# Patient Record
Sex: Female | Born: 1960 | Race: White | Hispanic: No | Marital: Single | Smoking: Current every day smoker
Health system: Southern US, Community
[De-identification: ages and names within clinical notes are randomized; demographics above are authoritative.]

## PROBLEM LIST (undated history)

## (undated) DIAGNOSIS — E7800 Pure hypercholesterolemia, unspecified: Secondary | ICD-10-CM

## (undated) DIAGNOSIS — F410 Panic disorder [episodic paroxysmal anxiety] without agoraphobia: Secondary | ICD-10-CM

## (undated) DIAGNOSIS — F331 Major depressive disorder, recurrent, moderate: Secondary | ICD-10-CM

## (undated) DIAGNOSIS — J449 Chronic obstructive pulmonary disease, unspecified: Secondary | ICD-10-CM

## (undated) HISTORY — DX: Panic disorder (episodic paroxysmal anxiety): F41.0

## (undated) HISTORY — DX: Major depressive disorder, recurrent, moderate: F33.1

## (undated) HISTORY — DX: Pure hypercholesterolemia, unspecified: E78.00

## (undated) HISTORY — DX: Chronic obstructive pulmonary disease, unspecified: J44.9

---

## 2004-06-12 ENCOUNTER — Ambulatory Visit: Payer: Self-pay

## 2005-06-11 ENCOUNTER — Ambulatory Visit: Payer: Self-pay

## 2006-05-28 ENCOUNTER — Ambulatory Visit: Payer: Self-pay | Admitting: Nurse Practitioner

## 2007-12-23 ENCOUNTER — Ambulatory Visit: Payer: Self-pay | Admitting: *Deleted

## 2008-10-16 ENCOUNTER — Ambulatory Visit: Payer: Self-pay | Admitting: Family Medicine

## 2009-01-02 ENCOUNTER — Ambulatory Visit: Payer: Self-pay | Admitting: Family Medicine

## 2009-01-15 ENCOUNTER — Ambulatory Visit: Payer: Self-pay | Admitting: Family Medicine

## 2009-02-15 ENCOUNTER — Ambulatory Visit: Payer: Self-pay | Admitting: Unknown Physician Specialty

## 2010-03-13 ENCOUNTER — Ambulatory Visit: Payer: Self-pay | Admitting: Family Medicine

## 2011-03-26 ENCOUNTER — Ambulatory Visit: Payer: Self-pay | Admitting: Family Medicine

## 2012-04-07 ENCOUNTER — Ambulatory Visit: Payer: Self-pay | Admitting: Family Medicine

## 2013-05-04 ENCOUNTER — Ambulatory Visit: Payer: Self-pay | Admitting: Family Medicine

## 2014-05-10 ENCOUNTER — Ambulatory Visit: Payer: Self-pay | Admitting: Family Medicine

## 2014-06-08 ENCOUNTER — Ambulatory Visit: Payer: Self-pay | Admitting: Family Medicine

## 2014-07-16 ENCOUNTER — Inpatient Hospital Stay: Payer: Self-pay | Admitting: Internal Medicine

## 2014-07-16 LAB — URINALYSIS, COMPLETE
Bacteria: NONE SEEN
Bilirubin,UR: NEGATIVE
Blood: NEGATIVE
Glucose,UR: NEGATIVE mg/dL (ref 0–75)
Leukocyte Esterase: NEGATIVE
Nitrite: NEGATIVE
Ph: 6 (ref 4.5–8.0)
Protein: NEGATIVE
RBC,UR: 2 /HPF (ref 0–5)
Specific Gravity: 1.012 (ref 1.003–1.030)
Squamous Epithelial: 4
WBC UR: 5 /HPF (ref 0–5)

## 2014-07-16 LAB — COMPREHENSIVE METABOLIC PANEL
Albumin: 3.2 g/dL — ABNORMAL LOW (ref 3.4–5.0)
Alkaline Phosphatase: 56 U/L
Anion Gap: 7 (ref 7–16)
BUN: 9 mg/dL (ref 7–18)
Bilirubin,Total: 0.4 mg/dL (ref 0.2–1.0)
Calcium, Total: 9.3 mg/dL (ref 8.5–10.1)
Chloride: 104 mmol/L (ref 98–107)
Co2: 26 mmol/L (ref 21–32)
Creatinine: 0.87 mg/dL (ref 0.60–1.30)
EGFR (African American): >60
EGFR (Non-African Amer.): >60
Glucose: 98 mg/dL (ref 65–99)
Osmolality: 272 (ref 275–301)
Potassium: 3.6 mmol/L (ref 3.5–5.1)
SGOT(AST): 43 U/L — ABNORMAL HIGH (ref 15–37)
SGPT (ALT): 40 U/L
Sodium: 137 mmol/L (ref 136–145)
Total Protein: 8.1 g/dL (ref 6.4–8.2)

## 2014-07-16 LAB — CBC WITH DIFFERENTIAL/PLATELET
Basophil #: 0.1 10*3/uL (ref 0.0–0.1)
Basophil %: 0.7 %
Eosinophil #: 0 10*3/uL (ref 0.0–0.7)
Eosinophil %: 0.2 %
HCT: 44.8 % (ref 35.0–47.0)
HGB: 14.7 g/dL (ref 12.0–16.0)
Lymphocyte #: 2.4 10*3/uL (ref 1.0–3.6)
Lymphocyte %: 16.4 %
MCH: 30.4 pg (ref 26.0–34.0)
MCHC: 32.8 g/dL (ref 32.0–36.0)
MCV: 93 fL (ref 80–100)
Monocyte #: 1.1 x10 3/mm — ABNORMAL HIGH (ref 0.2–0.9)
Monocyte %: 7.6 %
Neutrophil #: 11.1 10*3/uL — ABNORMAL HIGH (ref 1.4–6.5)
Neutrophil %: 75.1 %
Platelet: 263 10*3/uL (ref 150–440)
RBC: 4.84 10*6/uL (ref 3.80–5.20)
RDW: 12.5 % (ref 11.5–14.5)
WBC: 14.7 10*3/uL — ABNORMAL HIGH (ref 3.6–11.0)

## 2014-07-16 LAB — LIPASE, BLOOD: Lipase: 94 U/L (ref 73–393)

## 2014-07-17 LAB — CBC WITH DIFFERENTIAL/PLATELET
Basophil #: 0 10*3/uL (ref 0.0–0.1)
Basophil %: 0.1 %
Eosinophil #: 0 10*3/uL (ref 0.0–0.7)
Eosinophil %: 0 %
HCT: 40.1 % (ref 35.0–47.0)
HGB: 13.5 g/dL (ref 12.0–16.0)
Lymphocyte #: 1.8 10*3/uL (ref 1.0–3.6)
Lymphocyte %: 9.7 %
MCH: 30.5 pg (ref 26.0–34.0)
MCHC: 33.6 g/dL (ref 32.0–36.0)
MCV: 91 fL (ref 80–100)
Monocyte #: 0.5 x10 3/mm (ref 0.2–0.9)
Monocyte %: 2.4 %
Neutrophil #: 16.4 10*3/uL — ABNORMAL HIGH (ref 1.4–6.5)
Neutrophil %: 87.8 %
Platelet: 256 10*3/uL (ref 150–440)
RBC: 4.43 10*6/uL (ref 3.80–5.20)
RDW: 12.4 % (ref 11.5–14.5)
WBC: 18.7 10*3/uL — ABNORMAL HIGH (ref 3.6–11.0)

## 2014-07-19 LAB — CBC WITH DIFFERENTIAL/PLATELET
Basophil #: 0 10*3/uL (ref 0.0–0.1)
Basophil %: 0.1 %
Eosinophil #: 0 10*3/uL (ref 0.0–0.7)
Eosinophil %: 0 %
HCT: 40.2 % (ref 35.0–47.0)
HGB: 13.1 g/dL (ref 12.0–16.0)
Lymphocyte #: 2.1 10*3/uL (ref 1.0–3.6)
Lymphocyte %: 7.7 %
MCH: 30.2 pg (ref 26.0–34.0)
MCHC: 32.7 g/dL (ref 32.0–36.0)
MCV: 92 fL (ref 80–100)
Monocyte #: 0.8 x10 3/mm (ref 0.2–0.9)
Monocyte %: 3.1 %
Neutrophil #: 24.1 10*3/uL — ABNORMAL HIGH (ref 1.4–6.5)
Neutrophil %: 89.1 %
Platelet: 336 10*3/uL (ref 150–440)
RBC: 4.34 10*6/uL (ref 3.80–5.20)
RDW: 12.6 % (ref 11.5–14.5)
WBC: 27 10*3/uL — ABNORMAL HIGH (ref 3.6–11.0)

## 2014-07-19 LAB — BASIC METABOLIC PANEL
Anion Gap: 7 (ref 7–16)
BUN: 14 mg/dL (ref 7–18)
Calcium, Total: 8.8 mg/dL (ref 8.5–10.1)
Chloride: 105 mmol/L (ref 98–107)
Co2: 24 mmol/L (ref 21–32)
Creatinine: 0.9 mg/dL (ref 0.60–1.30)
EGFR (African American): >60
EGFR (Non-African Amer.): >60
Glucose: 200 mg/dL — ABNORMAL HIGH (ref 65–99)
Osmolality: 278 (ref 275–301)
Potassium: 4.3 mmol/L (ref 3.5–5.1)
Sodium: 136 mmol/L (ref 136–145)

## 2014-07-19 LAB — SEDIMENTATION RATE: Erythrocyte Sed Rate: 88 mm/hr — ABNORMAL HIGH (ref 0–30)

## 2014-07-20 LAB — CBC WITH DIFFERENTIAL/PLATELET
Basophil #: 0 10*3/uL (ref 0.0–0.1)
Basophil %: 0 %
Eosinophil #: 0 10*3/uL (ref 0.0–0.7)
Eosinophil %: 0 %
HCT: 40.7 % (ref 35.0–47.0)
HGB: 13.4 g/dL (ref 12.0–16.0)
Lymphocyte #: 2.4 10*3/uL (ref 1.0–3.6)
Lymphocyte %: 9.5 %
MCH: 30.6 pg (ref 26.0–34.0)
MCHC: 32.9 g/dL (ref 32.0–36.0)
MCV: 93 fL (ref 80–100)
Monocyte #: 1.1 x10 3/mm — ABNORMAL HIGH (ref 0.2–0.9)
Monocyte %: 4.6 %
Neutrophil #: 21.4 10*3/uL — ABNORMAL HIGH (ref 1.4–6.5)
Neutrophil %: 85.9 %
Platelet: 357 10*3/uL (ref 150–440)
RBC: 4.37 10*6/uL (ref 3.80–5.20)
RDW: 12.8 % (ref 11.5–14.5)
WBC: 24.9 10*3/uL — ABNORMAL HIGH (ref 3.6–11.0)

## 2014-07-22 LAB — BASIC METABOLIC PANEL
Anion Gap: 8 (ref 7–16)
BUN: 18 mg/dL (ref 7–18)
Calcium, Total: 8.6 mg/dL (ref 8.5–10.1)
Chloride: 105 mmol/L (ref 98–107)
Co2: 26 mmol/L (ref 21–32)
Creatinine: 0.69 mg/dL (ref 0.60–1.30)
EGFR (African American): >60
EGFR (Non-African Amer.): >60
Glucose: 207 mg/dL — ABNORMAL HIGH (ref 65–99)
Osmolality: 285 (ref 275–301)
Potassium: 4.4 mmol/L (ref 3.5–5.1)
Sodium: 139 mmol/L (ref 136–145)

## 2014-07-22 LAB — WBC: WBC: 24.2 10*3/uL — ABNORMAL HIGH (ref 3.6–11.0)

## 2014-11-21 DIAGNOSIS — F331 Major depressive disorder, recurrent, moderate: Secondary | ICD-10-CM | POA: Insufficient documentation

## 2014-11-21 DIAGNOSIS — F410 Panic disorder [episodic paroxysmal anxiety] without agoraphobia: Secondary | ICD-10-CM | POA: Insufficient documentation

## 2014-11-22 NOTE — Discharge Summary (Signed)
PATIENT NAME:  Margaret Delgado, Margaret Delgado MR#:  [id number] DATE OF BIRTH:  [birth year]  DATE OF ADMISSION:  07/16/2014 DATE OF DISCHARGE:  07/23/2014  ADMITTING PHYSICIAN:  Dr. Hillary Bow  PRIMARY CARE PHYSICIAN: Delight Stare, MD.   HOSPITAL CONSULTATIONS:  Pulmonary consultation by Dr. Raul Del.   DISCHARGE DIAGNOSES:  1.  Acute respiratory failure.  2.  Acute interstitial pneumonitis.  3.  Tobacco use disorder.  4.  Anxiety.  5.  Hyperlipidemia.   DISCHARGE HOME MEDICATIONS:  1.  Lipitor 40 mg p.o. at bedtime.  2.  Sertraline 150 mg p.o. at bedtime.  3.  Ativan 0.5 mg p.o. b.i.d. p.r.n.  4.  Prednisone taper over 12 days.  5.  Levaquin 500 mg p.o. daily for three more days.  6.  Symbicort 160/4.5 two puffs twice a day.  7.  Nystatin 100,000 units/ 17m suspension - 5 mL every six hours for seven days- to swish and swallow.  8.  Albuterol inhaler two puffs 4 times a day as needed for shortness of breath and wheezing.   DISCHARGE HOME OXYGEN: None.   DISCHARGE DIET:  Regular diet.   DISCHARGE ACTIVITY: As tolerated.    FOLLOWUP INSTRUCTIONS:  1.  Follow up with Dr. FRaul Delin 1 to 2 weeks.  2.  Follow-up hypersensitivity panel results with him next week and also will need a CT chest in three months.   3.  PCP follow-up in two weeks.  4,  Advised to stop smoking.   LABORATORY AND IMAGING STUDIES:  Prior to discharge  1.  Sodium 139, potassium 4.4, chloride 105, bicarbonate 26, BUN 18, creatinine 0.69, glucose 207, and calcium of 8.6.  2.  WBC 24.2, hemoglobin 13.4, hematocrit 40.7, platelet count 357. 3.  Chest x-ray on July 22, 2014, showing stable bibasilar hazy airspace disease, left greater than right.   4.  ANA panel showing slightly elevated NT RNP antibodies which could be nonspecific.  ESR is elevated at 88, rheumatoid factor is slightly elevated again at 16.3. Ace enzyme is within normal limits. Hypersensitivity pneumonitis panel is pending.   CT of the chest with contrast  showing no evidence of PE, but patchy areas of ground glass opacity throughout bilateral lungs.  Differential could be infectious, inflammatory or hypersensitivity pneumonitis. Less likely to be alveolar hemorrhage.  Mediastinal lymphadenopathy noted. Urinalysis negative for any infection.   BRIEF HOSPITAL COURSE:  Ms. SLeverettis a 54year old Caucasian female with past medical history significant for possible underlying COPD, chronic smoking, depression, anxiety, who presents to the hospital secondary to myalgia, fever and also significant difficulty breathing.  Acute respiratory failure secondary to hypersensitivity pneumonitis, likely is hypoxic requiring up to 4 liters of oxygen now for several days into the admission. He was started on steroids, antibiotics. CT of the chest was done to rule out PE which was negative but showed possible pneumonitis. Seen by Dr. FRaul Delin the hospital who recommended to continue steroids, antibiotics and if she cannot be weaned off oxygen recommended a bronchoscopy. However, the patient started improving slowly and one week into admission she is off the oxygen and ambulating fine, only scattered wheeze on exam. She will finish off her steroids and antibiotics and will follow up with Dr. FRaul Delas an outpatient and outpatient CT chest recommended. Hypersensitivity pneumonitis pattern is pending at this time. Inhalers, bronchodilators have also been added for possible underlying COPD.  She was strongly counseled against smoking at this time. She developed oral thrush, being on steroids in  the hospital.  Nystatin is being dispensed at the time of discharge.  2.  Depression and anxiety on sertraline and Ativan. 3.  Hyperlipidemia. Continue Lipitor.  4.  Course of the patient has been otherwise uneventful in the hospital.   DISCHARGE CONDITION: Stable.   DISCHARGE DISPOSITION: Home.   TIME SPENT ON DISCHARGE:  Forty minutes.    ____________________________ Gladstone Lighter, MD rk:at D: 07/23/2014 12:18:02 ET T: 07/23/2014 12:50:18 ET JOB#: [id number]  cc: Gladstone Lighter, MD, <Dictator> Herbon E. Raul Del, MD [name], MD Gladstone Lighter MD ELECTRONICALLY SIGNED 08/14/2014 16:07

## 2014-11-22 NOTE — H&P (Signed)
PATIENT NAME:  Margaret Delgado, Margaret Delgado MR#:  [id number] DATE OF BIRTH:  Jul 04, 1961  DATE OF ADMISSION:  07/16/2014  PRIMARY CARE PHYSICIAN: Delight Stare, MD  CHIEF COMPLAINT: Shortness of breath, myalgias, fever.   HISTORY OF PRESENT ILLNESS: A 54 year old Caucasian female patient with history of tobacco abuse, hyperlipidemia, depression, presents to the Emergency Room with progressively worsening symptoms over the last 1 week of cough, shortness of breath, myalgias and fever. The patient mentions that she has had fever to the maximum of 104 on 07/11/2014. She has had multiple episodes where she did not check it, but she used to have severely high temperature, chills and then break out in sweats and feel better. The patient was seen at Urgent Care 5 days back, had influenza test done which was negative, but  considering her typical symptoms she was started on Tamiflu and today is day 5, which she finished. The patient was also treated with Augmentin for a month for chronic frontal sinusitis by her primary care physician, the antibiotics which she finished a week prior. She has been using her albuterol inhaler at home without any benefit. Here in the Emergency Room, the patient has received multiple rounds of nebulizers, continues to be short of breath, wheezing, saturations have dropped into the high 80s on room air and is being admitted to the hospitalist service for acute bronchitis and along with sepsis. Her heart rate was 95 and white count is 14.5.   PAST MEDICAL HISTORY:  1.  Hyperlipidemia.  2.  Depression.  3.  Tobacco abuse.  4.  Possible COPD.   FAMILY HISTORY: Hypertension.   SOCIAL HISTORY: The patient smokes a pack a day. No alcohol. No illicit drug use. Ambulates on her own.   CODE STATUS: Full code.   ALLERGIES: No drug allergies.   REVIEW OF SYSTEMS:  CONSTITUTIONAL: Complains of severe fatigue, weakness.  EYES: No blurred vision, pain or redness.  EARS, NOSE, AND THROAT: No tinnitus,  ear pain, hearing loss.  RESPIRATORY: Has cough, wheeze, tobacco abuse.  CARDIOVASCULAR: No chest pain, orthopnea, edema. GASTROINTESTINAL: No nausea, vomiting, diarrhea or abdominal pain. GENITOURINARY: No dysuria, hematuria or frequency. ENDOCRINOLOGY: No polyuria, nocturia or thyroid problems. HEMATOLOGY AND LYMPHATICS: No anemia, easy bruising, bleeding. INTEGUMENTARY: No acne, rash, lesion.  MUSCULOSKELETAL: Has generalized muscle aches. NEUROLOGICAL: No focal numbness, weakness, seizure.  PSYCHIATRY: Has anxiety.   HOME MEDICATIONS: Atorvastatin, lorazepam, albuterol and Zoloft of unknown doses.   PHYSICAL EXAMINATION:  VITAL SIGNS: Shows temperature 98.1, pulse of 95, blood pressure 120/62, saturating 87% on room air, 91% on 2 liters oxygen.  GENERAL: Obese Caucasian female patient sitting at the side of the bed with respiratory distress, 1 or 2 word conversational dyspnea, audible wheezing.  PSYCHIATRIC: Alert, oriented x 3, anxious.  HEENT: Atraumatic, normocephalic. Oral mucosa dry and pink. External ears and nose normal. No pallor. No icterus. Pupils bilaterally equal and reactive to light.  NECK: Supple. No thyromegaly. No palpable lymph nodes. Trachea midline. No carotid bruit, JVD.  CARDIOVASCULAR: S1, S2, tachycardic without any murmurs. Peripheral pulses 2+. No edema.  RESPIRATORY: Increased work of breathing, bilateral wheezing, coarse breath sounds.  GASTROINTESTINAL: Soft abdomen, nontender. Bowel sounds present. Organomegaly palpable.  SKIN: Warm and dry. No petechiae, rash, ulcers.  MUSCULOSKELETAL: No joint swelling, redness, effusion of the large joints. Normal muscle tone.  NEUROLOGICAL: Motor strength 5/5 in upper extremities. Sensation intact all over.  LYMPHATIC: No cervical lymphadenopathy.    LABORATORY AND IMAGING STUDIES: Chest x-ray shows bilateral  basilar atelectasis, no acute findings.   Glucose 98, BUN 9, creatinine 0.87, sodium 137, potassium 3.6, GFR  greater than 60. AST, ALT, alkaline phosphatase, bilirubin normal. WBC 14.7, hemoglobin 14.7, platelets 263,000,  neutrophils 75%.   ASSESSMENT AND PLAN:  1.  Acute bronchitis with acute respiratory failure and sepsis in a patient who continues to smoke. At this point, the patient will be admitted as inpatient and started on scheduled nebulizers, IV steroids and antibiotics with Levaquin. The patient had influenza test done at Urgent Care which was negative. She has finished a course of Tamiflu. The patient has been counseled to quit smoking for greater than 3 minutes. We will also give her 500 mL of normal saline now for her sepsis bolus. Scheduled nebulizers. Send for sputum cultures.  2.  Anxiety. The patient takes lorazepam at home, which will be continued p.r.n.   CODE STATUS: Full code.   TIME SPENT ON THIS CASE: Forty-five minutes.   ____________________________ Leia Alf Jeffery Gammell, MD srs:TT D: 07/16/2014 19:12:55 ET T: 07/16/2014 20:18:44 ET JOB#: [id number]  cc: Alveta Heimlich R. Darvin Neighbours, MD, <Dictator> [name], MD Neita Carp MD ELECTRONICALLY SIGNED 08/11/2014 12:46

## 2015-05-11 ENCOUNTER — Other Ambulatory Visit: Payer: Self-pay | Admitting: Family Medicine

## 2015-05-11 DIAGNOSIS — Z1231 Encounter for screening mammogram for malignant neoplasm of breast: Secondary | ICD-10-CM

## 2015-05-23 ENCOUNTER — Ambulatory Visit: Payer: Self-pay

## 2015-05-31 ENCOUNTER — Ambulatory Visit
Admission: RE | Admit: 2015-05-31 | Discharge: 2015-05-31 | Disposition: A | Discharge status: Home / Self Care | Payer: Medicare Other | Source: Ambulatory Visit | Attending: Family Medicine | Admitting: Family Medicine

## 2015-05-31 ENCOUNTER — Other Ambulatory Visit: Payer: Self-pay | Admitting: Family Medicine

## 2015-05-31 DIAGNOSIS — Z1231 Encounter for screening mammogram for malignant neoplasm of breast: Secondary | ICD-10-CM | POA: Diagnosis present

## 2015-09-17 ENCOUNTER — Encounter: Payer: Self-pay | Admitting: Psychiatry

## 2015-09-17 ENCOUNTER — Ambulatory Visit: Payer: Medicare Other | Admitting: Psychiatry

## 2015-09-17 DIAGNOSIS — I10 Essential (primary) hypertension: Secondary | ICD-10-CM | POA: Insufficient documentation

## 2015-09-17 DIAGNOSIS — I739 Peripheral vascular disease, unspecified: Secondary | ICD-10-CM | POA: Insufficient documentation

## 2015-09-17 DIAGNOSIS — E785 Hyperlipidemia, unspecified: Secondary | ICD-10-CM | POA: Insufficient documentation

## 2015-09-17 MED ORDER — TRAZODONE HCL 50 MG PO TABS: 50.0000 mg | ORAL_TABLET | Freq: Every day | ORAL | Status: DC

## 2015-09-17 MED ORDER — VENLAFAXINE HCL ER 37.5 MG PO CP24: 37.5000 mg | ORAL_CAPSULE | Freq: Every day | ORAL | Status: DC

## 2015-09-17 NOTE — Progress Notes (Signed)
[facility] MD/PA/NP OP Progress Note  09/17/2015 10:50 AM Margaret Delgado  MRN:  [id number]  Subjective:  Patient is a 55 yo WF with history of MDD who was seen by Dr.Williams in March of 2016 for an initial assessment. She had not followed up after that. States she did not connect with Dr.Williams. States she has not been taking any medication for her depression, taking Lorazepam twice daily as prescribed by her primary care physician Dr.Miles. Patient today reports being depressed, having no energy, difficulty getting out of bed and feeling like she has nothing to live for. States she has not been taking care of her house or cleaning up. Denies suicidal thoughts. States she has realized she needs to be on medication for her depression and understands she cannot just wean herself of medications. States she has in the past taken antidepressants for 3-4 months and stopped once she started feeling better. Patient reports having obsessive thoughts about death and religious thoughts surrounding dying. She has 2 teenage boys and the father lives close by but is not supportive. Patient gets disability but states that is not enough for her expenses.  States she would like to feel better and get a part time job.  Patients reports some binges with alcohol. The last time she drank was 2 months ago and drank 4 beers daily for few days.  Chief Complaint:  Chief Complaint    Follow-up; Medication Refill; Anxiety; Depression; Fatigue; Stress     Visit Diagnosis:  No diagnosis found.  Past Medical History:  Past Medical History  Diagnosis Date  . Major depressive disorder, recurrent episode, moderate ([place])   . Panic disorder without agoraphobia   . COPD (chronic obstructive pulmonary disease) ([place])   . Hypercholesterolemia     Past Surgical History  Procedure Laterality Date  . Cesarean section     Family History:  Family History  Problem Relation Age of Onset  . Breast cancer Paternal Grandmother 16  .  Parkinson's disease Mother   . Depression Mother   . Anxiety disorder Mother   . Cancer Father   . Stroke Sister   . Anxiety disorder Sister   . Depression Sister    Social History:  Social History   Social History  . Marital Status: Single    Spouse Name: N/A  . Number of Children: N/A  . Years of Education: N/A   Social History Main Topics  . Smoking status: Current Every Day Smoker -- 1.00 packs/day    Types: Cigarettes    Start date: 09/16/1980  . Smokeless tobacco: Never Used  . Alcohol Use: No  . Drug Use: No  . Sexual Activity: Not Currently   Other Topics Concern  . None   Social History Narrative   Additional History: Patient has long history of MDD and anxiety. She and reports severe anxiety surrounding her ex and his mother. Reports anxiety about death and dying. States this has become worse in the last couple of months. SHe has never been hospitalized psychiatrically and has never had any suicide attempts.  Assessment:   Musculoskeletal: Strength & Muscle Tone: within normal limits Gait & Station: normal Patient leans: N/A  Psychiatric Specialty Exam: HPI  ROS  Blood pressure 132/82, pulse 81, temperature 97.4 F (36.3 C), temperature source Tympanic, height 5' 5.5" (1.664 m), weight 206 lb (93.441 kg), SpO2 96 %.Body mass index is 33.75 kg/(m^2).  General Appearance: Casual  Eye Contact:  Fair  Speech:  Clear and Coherent  Volume:  Normal  Mood:  Depressed and Dysphoric  Affect:  Constricted and Depressed  Thought Process:  Coherent  Orientation:  Full (Time, Place, and Person)  Thought Content:  Rumination  Suicidal Thoughts:  No  Homicidal Thoughts:  No  Memory:  Immediate;   Fair Recent;   Fair Remote;   Fair  Judgement:  Fair  Insight:  Fair  Psychomotor Activity:  Normal  Concentration:  Fair  Recall:  [employer] of Knowledge: Fair  Language: Fair  Akathisia:  No  Handed:  Right  AIMS (if indicated):    Assets:  Communication  Skills Desire for Improvement Housing  ADL's:  Intact  Cognition: WNL  Sleep:  okay   Is the patient at risk to self?  No. Has the patient been a risk to self in the past 6 months?  No. Has the patient been a risk to self within the distant past?  No. Is the patient a risk to others?  No. Has the patient been a risk to others in the past 6 months?  No. Has the patient been a risk to others within the distant past?  No.  Current Medications: Current Outpatient Prescriptions  Medication Sig Dispense Refill  . atorvastatin (LIPITOR) 40 MG tablet Take 1 tablet by mouth daily.    . clonazePAM (KLONOPIN) 0.5 MG tablet Take 0.5 mg by mouth 2 (two) times daily as needed for anxiety. 1 tablet two times daily as needed    . Multiple Vitamin tablet Take 1 tablet by mouth daily.     No current facility-administered medications for this visit.    Medical Decision Making:  Review of Psycho-Social Stressors (1), Established Problem, Worsening (2) and Review of New Medication or Change in Dosage (2)  Treatment Plan Summary:Medication management   MDD Start Effexor at 37.5mg  po qd. Side effects and benefits discussed. Increase patient to eat healthy and exercise since it helps with depression and anxiety. Decrease clonazepam to 1 tablet in the morning at 0.5 mg with plan to discontinue totally in the future. Patient agreeable to this plan  Generalized anxiety disorder Same as above for depression.  Insomnia Trazodone at 50 mg at bedtime.  Time spent with patient was 40 minutes and more than half the time was spent in counseling and coordination of care.   [name] 09/17/2015, 10:50 AM

## 2015-09-17 NOTE — Progress Notes (Signed)
Patient ID: Margaret Delgado, female   DOB: 06-02-1961, 55 y.o.   MRN: [id number]

## 2015-10-01 ENCOUNTER — Encounter: Payer: Self-pay | Admitting: Psychiatry

## 2015-10-01 ENCOUNTER — Ambulatory Visit: Payer: Medicare Other | Admitting: Psychiatry

## 2015-10-01 MED ORDER — HYDROXYZINE PAMOATE 25 MG PO CAPS: 25.0000 mg | ORAL_CAPSULE | Freq: Three times a day (TID) | ORAL | Status: DC | PRN

## 2015-10-01 MED ORDER — SERTRALINE HCL 25 MG PO TABS: 25.0000 mg | ORAL_TABLET | Freq: Every day | ORAL | Status: DC

## 2015-10-01 NOTE — Progress Notes (Signed)
Patient ID: Margaret Delgado, female   DOB: 1961-04-04, 55 y.o.   MRN: [id number] [facility] MD/PA/NP OP Progress Note  10/01/2015 9:54 AM JEYDA EDGEMAN  MRN:  [id number]  Subjective:  Patient is a 55 yo WF with history of MDD who was started on Effexor at 37.5 mg 2 weeks ago for severe depression. Margaret Delgado presents today stating that she has not had any relief from her depressed mood on the Effexor. She reports that she has had some severe jaw clenching and teeth grinding. She appears very anxious today. States that she is very overwhelmed by all the chores she needs to do at home. States that she had some trouble with her washing machine but was able to complete that. She denies any suicidal thoughts. States that she was able to taper off the Klonopin without any side effects. She also reports that she drinks about 8-10 cups of coffee daily. States that she has not been able to sleep on the trazodone and that it made her more restless.  Patients reports some binges with alcohol. The last time she drank was 2 months ago and drank 4 beers daily for few days.  Chief Complaint:  Chief Complaint    Follow-up; Medication Refill; Medication Problem; Panic Attack; Anxiety     Visit Diagnosis:  No diagnosis found.  Past Medical History:  Past Medical History  Diagnosis Date  . Major depressive disorder, recurrent episode, moderate ([place])   . Panic disorder without agoraphobia   . COPD (chronic obstructive pulmonary disease) ([place])   . Hypercholesterolemia     Past Surgical History  Procedure Laterality Date  . Cesarean section     Family History:  Family History  Problem Relation Age of Onset  . Breast cancer Paternal Grandmother 73  . Parkinson's disease Mother   . Depression Mother   . Anxiety disorder Mother   . Cancer Father   . Stroke Sister   . Anxiety disorder Sister   . Depression Sister    Social History:  Social History   Social History  . Marital Status: Single    Spouse Name: N/A   . Number of Children: N/A  . Years of Education: N/A   Social History Main Topics  . Smoking status: Current Every Day Smoker -- 1.00 packs/day    Types: Cigarettes    Start date: 09/16/1980  . Smokeless tobacco: Never Used  . Alcohol Use: No  . Drug Use: No  . Sexual Activity: Not Currently   Other Topics Concern  . None   Social History Narrative   Additional History: Patient has long history of MDD and anxiety. She and reports severe anxiety surrounding her ex and his mother. Reports anxiety about death and dying. States this has become worse in the last couple of months. SHe has never been hospitalized psychiatrically and has never had any suicide attempts.  Assessment:   Musculoskeletal: Strength & Muscle Tone: within normal limits Gait & Station: normal Patient leans: N/A  Psychiatric Specialty Exam: Anxiety      ROS  Blood pressure 124/82, pulse 81, temperature 98.7 F (37.1 C), temperature source Tympanic, height 5' 5.5" (1.664 m), weight 207 lb 6.4 oz (94.076 kg), SpO2 96 %.Body mass index is 33.98 kg/(m^2).  General Appearance: Casual  Eye Contact:  Fair  Speech:  Clear and Coherent  Volume:  Normal  Mood:  Depressed and Dysphoric  Affect:  Constricted and Depressed  Thought Process:  Coherent  Orientation:  Full (Time, Place,  and Person)  Thought Content:  Rumination  Suicidal Thoughts:  No  Homicidal Thoughts:  No  Memory:  Immediate;   Fair Recent;   Fair Remote;   Fair  Judgement:  Fair  Insight:  Fair  Psychomotor Activity:  Normal  Concentration:  Fair  Recall:  [employer] of Knowledge: Fair  Language: Fair  Akathisia:  No  Handed:  Right  AIMS (if indicated):    Assets:  Communication Skills Desire for Improvement Housing  ADL's:  Intact  Cognition: WNL  Sleep:  okay   Is the patient at risk to self?  No. Has the patient been a risk to self in the past 6 months?  No. Has the patient been a risk to self within the distant past?   No. Is the patient a risk to others?  No. Has the patient been a risk to others in the past 6 months?  No. Has the patient been a risk to others within the distant past?  No.  Current Medications: Current Outpatient Prescriptions  Medication Sig Dispense Refill  . atorvastatin (LIPITOR) 40 MG tablet Take 1 tablet by mouth daily.    . Multiple Vitamin tablet Take 1 tablet by mouth daily.    . traZODone (DESYREL) 50 MG tablet Take 1 tablet (50 mg total) by mouth at bedtime. 30 tablet 0  . venlafaxine XR (EFFEXOR XR) 37.5 MG 24 hr capsule Take 1 capsule (37.5 mg total) by mouth daily. 30 capsule 0  . clonazePAM (KLONOPIN) 0.5 MG tablet Take 0.5 mg by mouth 2 (two) times daily as needed for anxiety. Reported on 10/01/2015     No current facility-administered medications for this visit.    Medical Decision Making:  Review of Psycho-Social Stressors (1), Established Problem, Worsening (2) and Review of New Medication or Change in Dosage (2)  Treatment Plan Summary:Medication management   MDD Discontinue the Effexor. Start zoloft at 25mg  po qam. Encouraged  patient to eat healthy and exercise since it helps with depression and anxiety. Discussed starting a journal with brainstorming all the chores she needs to do and prioritizing about 5 chores and putting them down in order. Patient agreeable to this plan  Generalized anxiety disorder Same as above for depression.  Insomnia Patient recommended to drink chamomile tea. Discussed some sleep hygiene techniques. Decrease Coffee intake to 3-4 cups daily before 1:00 pm.  Time spent with patient was 25 minutes and more than half the time was spent in counseling and coordination of care.   [name] 10/01/2015, 9:54 AM

## 2015-10-16 ENCOUNTER — Ambulatory Visit: Payer: Medicare Other | Admitting: Psychiatry

## 2015-10-17 ENCOUNTER — Ambulatory Visit (INDEPENDENT_AMBULATORY_CARE_PROVIDER_SITE_OTHER): Payer: Medicare Other | Admitting: Psychiatry

## 2015-10-17 ENCOUNTER — Encounter: Payer: Self-pay | Admitting: Psychiatry

## 2015-10-17 DIAGNOSIS — F331 Major depressive disorder, recurrent, moderate: Secondary | ICD-10-CM | POA: Diagnosis not present

## 2015-10-17 MED ORDER — HYDROXYZINE PAMOATE 50 MG PO CAPS: 50.0000 mg | ORAL_CAPSULE | Freq: Three times a day (TID) | ORAL | Status: DC | PRN

## 2015-10-17 MED ORDER — SERTRALINE HCL 50 MG PO TABS: 50.0000 mg | ORAL_TABLET | Freq: Every day | ORAL | Status: DC

## 2015-10-17 NOTE — Progress Notes (Signed)
Patient ID: Margaret Delgado, female   DOB: 30-May-1961, 55 y.o.   MRN: [id number] [facility] MD/PA/NP OP Progress Note  10/17/2015 10:33 AM Margaret Delgado  MRN:  [id number]  Subjective:  Patient is a 55 yo WF with history of MDD who presents for a follow up today. States she did well for a few days and again had a couple of bad days.  Patient reports that she really felt well for 3 days and then over the last 2 days she has not been doing so well. States that she continues to get overwhelmed with her chores. She did look a little brighter and states that she actually took a shower yesterday. However she also reports looking into the mirror and being very upset about all the weight she has gained. States that she has been making efforts to eat more healthy. Denies any drinking binges since her last visit. States that she enjoys going to her church and being in the group. She is right now coming from a church activity and states that this gives her some energy.  Chief Complaint:  Chief Complaint    Follow-up; Medication Refill     Visit Diagnosis:  No diagnosis found.  Past Medical History:  Past Medical History  Diagnosis Date  . Major depressive disorder, recurrent episode, moderate ([place])   . Panic disorder without agoraphobia   . COPD (chronic obstructive pulmonary disease) ([place])   . Hypercholesterolemia     Past Surgical History  Procedure Laterality Date  . Cesarean section     Family History:  Family History  Problem Relation Age of Onset  . Breast cancer Paternal Grandmother 70  . Parkinson's disease Mother   . Depression Mother   . Anxiety disorder Mother   . Cancer Father   . Stroke Sister   . Anxiety disorder Sister   . Depression Sister    Social History:  Social History   Social History  . Marital Status: Single    Spouse Name: N/A  . Number of Children: N/A  . Years of Education: N/A   Social History Main Topics  . Smoking status: Current Every Day Smoker -- 1.00  packs/day    Types: Cigarettes    Start date: 09/16/1980  . Smokeless tobacco: Never Used  . Alcohol Use: No  . Drug Use: No  . Sexual Activity: Not Currently   Other Topics Concern  . None   Social History Narrative   Additional History: Patient has long history of MDD and anxiety. She and reports severe anxiety surrounding her ex and his mother. Reports anxiety about death and dying. States this has become worse in the last couple of months. SHe has never been hospitalized psychiatrically and has never had any suicide attempts.  Assessment:   Musculoskeletal: Strength & Muscle Tone: within normal limits Gait & Station: normal Patient leans: N/A  Psychiatric Specialty Exam: Anxiety      ROS  Blood pressure 120/82, pulse 78, temperature 97.4 F (36.3 C), temperature source Tympanic, height 5' 5.5" (1.664 m), weight 202 lb 6.4 oz (91.808 kg), SpO2 95 %.Body mass index is 33.16 kg/(m^2).  General Appearance: Casual  Eye Contact:  Fair  Speech:  Clear and Coherent  Volume:  Normal  Mood:  Depressed and Dysphoric  Affect:  Constricted and Depressed  Thought Process:  Coherent  Orientation:  Full (Time, Place, and Person)  Thought Content:  Rumination  Suicidal Thoughts:  No  Homicidal Thoughts:  No  Memory:  Immediate;   Fair Recent;   Fair Remote;   Fair  Judgement:  Fair  Insight:  Fair  Psychomotor Activity:  Normal  Concentration:  Fair  Recall:  [employer] of Knowledge: Fair  Language: Fair  Akathisia:  No  Handed:  Right  AIMS (if indicated):    Assets:  Communication Skills Desire for Improvement Housing  ADL's:  Intact  Cognition: WNL  Sleep:  okay   Is the patient at risk to self?  No. Has the patient been a risk to self in the past 6 months?  No. Has the patient been a risk to self within the distant past?  No. Is the patient a risk to others?  No. Has the patient been a risk to others in the past 6 months?  No. Has the patient been a risk to  others within the distant past?  No.  Current Medications: Current Outpatient Prescriptions  Medication Sig Dispense Refill  . atorvastatin (LIPITOR) 40 MG tablet Take 1 tablet by mouth daily.    . budesonide-formoterol (SYMBICORT) 160-4.5 MCG/ACT inhaler Inhale 2 puffs into the lungs 2 (two) times daily.    . Multiple Vitamin tablet Take 1 tablet by mouth daily.    . sertraline (ZOLOFT) 25 MG tablet Take 1 tablet (25 mg total) by mouth daily. 30 tablet 0   No current facility-administered medications for this visit.    Medical Decision Making:  Review of Psycho-Social Stressors (1), Established Problem, Worsening (2) and Review of New Medication or Change in Dosage (2)  Treatment Plan Summary:Medication management   MDD Increase Zoloft to 50 mg taken once daily for 2 weeks and patient can increase it to 75 mg if she tolerates the 50mg . Continue to eat healthy and exercise since it helps with depression and anxiety. Continue with journal with brainstorming all the chores she needs to do and prioritizing about 5 chores and putting them down in order.  Generalized anxiety disorder Same as above for depression.  Insomnia Patient recommended to drink chamomile tea. Discussed some sleep hygiene techniques.   Time spent with patient was 25 minutes and more than half the time was spent in counseling and coordination of care.   Margaret Delgado 10/17/2015, 10:33 AM

## 2015-10-31 ENCOUNTER — Ambulatory Visit: Payer: Medicare Other | Admitting: Anesthesiology

## 2015-11-12 ENCOUNTER — Other Ambulatory Visit: Payer: Self-pay

## 2015-11-12 NOTE — Telephone Encounter (Signed)
received faxed requesting a refill on medication sertraline hcl 50mg  take 1 and 1/2 tabs daily

## 2015-11-14 MED ORDER — SERTRALINE HCL 50 MG PO TABS: ORAL_TABLET | ORAL | Status: DC

## 2015-11-21 ENCOUNTER — Ambulatory Visit (INDEPENDENT_AMBULATORY_CARE_PROVIDER_SITE_OTHER): Payer: Medicare Other | Admitting: Psychiatry

## 2015-11-21 ENCOUNTER — Encounter: Payer: Self-pay | Admitting: Psychiatry

## 2015-11-21 VITALS — BP 128/86 | HR 73 | Temp 97.9°F | Ht 65.5 in | Wt 199.4 lb

## 2015-11-21 DIAGNOSIS — F331 Major depressive disorder, recurrent, moderate: Secondary | ICD-10-CM | POA: Diagnosis not present

## 2015-11-21 MED ORDER — SERTRALINE HCL 100 MG PO TABS: ORAL_TABLET | ORAL | Status: DC

## 2015-11-21 MED ORDER — BUPROPION HCL 75 MG PO TABS: 37.5000 mg | ORAL_TABLET | Freq: Every morning | ORAL | Status: DC

## 2015-11-21 NOTE — Progress Notes (Signed)
Patient ID: LORALYNN VOKES, female   DOB: 1961/02/03, 55 y.o.   MRN: [id number]  [facility] MD/PA/NP OP Progress Note  11/21/2015 11:27 AM CYERA GARDE  MRN:  [id number]  Subjective:  Patient is a 55 yo WF with history of MDD who presents for a follow up today. States she continues to feel depressed on zoloft at 75mg  daily. States she feels like she is just "existing". Continues to have trouble getting out of bed, decreased energy and no motivation. Sleeping okay, says she eats too much. Has not been to [place] in a while. Says her anxiety is better. Says she is not crying as much. Her sons are concerned that she is always in bed. She feels bad about not having a productive life. She and has had better days at her last visit but this time and reports increased depression again. Denies any suicidal thoughts.   Chief Complaint: still depressed  Visit Diagnosis:     ICD-9-CM ICD-10-CM   1. MDD (major depressive disorder), recurrent episode, moderate ([place]) 296.32 F33.1     Past Medical History:  Past Medical History  Diagnosis Date  . Major depressive disorder, recurrent episode, moderate ([place])   . Panic disorder without agoraphobia   . COPD (chronic obstructive pulmonary disease) ([place])   . Hypercholesterolemia     Past Surgical History  Procedure Laterality Date  . Cesarean section     Family History:  Family History  Problem Relation Age of Onset  . Breast cancer Paternal Grandmother 62  . Parkinson's disease Mother   . Depression Mother   . Anxiety disorder Mother   . Cancer Father   . Stroke Sister   . Anxiety disorder Sister   . Depression Sister    Social History:  Social History   Social History  . Marital Status: Single    Spouse Name: N/A  . Number of Children: N/A  . Years of Education: N/A   Social History Main Topics  . Smoking status: Current Every Day Smoker -- 1.00 packs/day    Types: Cigarettes    Start date: 09/16/1980  . Smokeless tobacco: Never Used  .  Alcohol Use: No  . Drug Use: No  . Sexual Activity: Not Currently   Other Topics Concern  . Not on file   Social History Narrative   Additional History: Patient has long history of MDD and anxiety. She and reports severe anxiety surrounding her ex and his mother. Reports anxiety about death and dying. States this has become worse in the last couple of months. SHe has never been hospitalized psychiatrically and has never had any suicide attempts.  Assessment:   Musculoskeletal: Strength & Muscle Tone: within normal limits Gait & Station: normal Patient leans: N/A  Psychiatric Specialty Exam: Anxiety      ROS  There were no vitals taken for this visit.There is no weight on file to calculate BMI.  General Appearance: Casual  Eye Contact:  Fair  Speech:  Clear and Coherent  Volume:  Normal  Mood:  Depressed and Dysphoric  Affect:  restricted  Thought Process:  Coherent  Orientation:  Full (Time, Place, and Person)  Thought Content:  Rumination  Suicidal Thoughts:  No  Homicidal Thoughts:  No  Memory:  Immediate;   Fair Recent;   Fair Remote;   Fair  Judgement:  Fair  Insight:  Fair  Psychomotor Activity:  Normal  Concentration:  Fair  Recall:  [employer] of Knowledge: Fair  Language: Fair  Akathisia:  No  Handed:  Right  AIMS (if indicated):    Assets:  Communication Skills Desire for Improvement Housing  ADL's:  Intact  Cognition: WNL  Sleep:  okay   Is the patient at risk to self?  No. Has the patient been a risk to self in the past 6 months?  No. Has the patient been a risk to self within the distant past?  No. Is the patient a risk to others?  No. Has the patient been a risk to others in the past 6 months?  No. Has the patient been a risk to others within the distant past?  No.  Current Medications: Current Outpatient Prescriptions  Medication Sig Dispense Refill  . atorvastatin (LIPITOR) 40 MG tablet Take 1 tablet by mouth daily.    .  budesonide-formoterol (SYMBICORT) 160-4.5 MCG/ACT inhaler Inhale 2 puffs into the lungs 2 (two) times daily.    . hydrOXYzine (VISTARIL) 50 MG capsule Take 1 capsule (50 mg total) by mouth 3 (three) times daily as needed. 30 capsule 0  . Multiple Vitamin tablet Take 1 tablet by mouth daily.    . sertraline (ZOLOFT) 50 MG tablet Take 1 and 1/2 tablets by mouth daily. For a total of 75mg  45 tablet 0   No current facility-administered medications for this visit.    Medical Decision Making:  Review of Psycho-Social Stressors (1), Established Problem, Worsening (2) and Review of New Medication or Change in Dosage (2)  Treatment Plan Summary:Medication management   MDD Increase Zoloft to 100 mg po daily. Start Bupropion at 37.5mg  po qam in the morning. Continue to eat healthy and exercise since it helps with depression and anxiety.  Generalized anxiety disorder Same as above for depression.  Discussed with patient having a structured day to help her get out of bed and has a list of chores to do.  Time spent with patient was 25 minutes and more than half the time was spent in counseling and coordination of care. Return to clinic in 2-4 weeks time or call before if needed.   Sameka Bagent 11/21/2015, 11:27 AM

## 2015-11-22 ENCOUNTER — Telehealth: Payer: Self-pay

## 2015-11-22 NOTE — Telephone Encounter (Signed)
[phone] 

## 2015-11-22 NOTE — Telephone Encounter (Signed)
RECEIVED TWO MESSAGE FROM [facility].  PT WAS SEEN 11-21-15 AND THE RX YOU SENT OVER NEED TO BE CORRECTED AND RESENT.   1ST RX WAS ZOLOFT YOU SENT OVER A 100MG  TAKE 1 AND 1/2 TABLET BY MOUTH DAILY FOR A TOTAL OF 75MG .  IF YOU WANT 75MG  THEN YOU WILL NEED TO SEND A ZOLOFT 50MG  TAKE ONE AND 1/2 TO = 75MG  2ND RX WAS FOR THE WELLBUTRIN TAKE A 1/2 TABLET THAT MEDICATION CAN NOT BE CUT IN HALF.

## 2015-11-22 NOTE — Telephone Encounter (Signed)
Can you send me the pharmacy number please?

## 2015-12-05 ENCOUNTER — Encounter: Payer: Self-pay | Admitting: Psychiatry

## 2015-12-05 ENCOUNTER — Ambulatory Visit (INDEPENDENT_AMBULATORY_CARE_PROVIDER_SITE_OTHER): Payer: Medicare Other | Admitting: Psychiatry

## 2015-12-05 VITALS — BP 120/80 | HR 77 | Temp 97.9°F | Ht 65.5 in | Wt 201.4 lb

## 2015-12-05 DIAGNOSIS — F331 Major depressive disorder, recurrent, moderate: Secondary | ICD-10-CM

## 2015-12-05 MED ORDER — SERTRALINE HCL 100 MG PO TABS: ORAL_TABLET | ORAL | Status: DC

## 2015-12-05 MED ORDER — BUPROPION HCL 75 MG PO TABS: 75.0000 mg | ORAL_TABLET | Freq: Every morning | ORAL | Status: DC

## 2015-12-05 NOTE — Progress Notes (Signed)
Patient ID: Margaret Delgado, female   DOB: 1960/09/05, 55 y.o.   MRN: [id number]   [facility] MD/PA/NP OP Progress Note  12/05/2015 10:14 AM Margaret Delgado  MRN:  [id number]  Subjective:  Patient is a 55 yo WF with history of MDD who presents for a follow up today. Patient tolerating the increase in Zoloft well. She feels the Wellbutrin is helping as well. She has more energy to do things, she is less depressed. Her anxiety is improving. States she feels motivated but having trouble doing things that she wants to do. States that she does think a lot about death and dying. She kind of questions the purpose of doing things if we are all going to die anyways. Discussed with patient that this is called existential crisis and this is something many people grappled with. Discussed that she could benefit from seeing a therapist and  develop coping skills to deal with these issues . She reports that mood has improved significantly over the last 3 days and on a scale of 1-10 with 10 being her best mood she feels she is at an 8. She has not been crying. Patient appears with a more animated affect today. Sleeping well and eating okay.Denies any suicidal thoughts.   Chief Complaint: Mood improving Chief Complaint    Anxiety; Follow-up; Medication Refill     Visit Diagnosis:     ICD-9-CM ICD-10-CM   1. MDD (major depressive disorder), recurrent episode, moderate ([place]) 296.32 F33.1     Past Medical History:  Past Medical History  Diagnosis Date  . Major depressive disorder, recurrent episode, moderate ([place])   . Panic disorder without agoraphobia   . COPD (chronic obstructive pulmonary disease) ([place])   . Hypercholesterolemia     Past Surgical History  Procedure Laterality Date  . Cesarean section     Family History:  Family History  Problem Relation Age of Onset  . Breast cancer Paternal Grandmother 51  . Parkinson's disease Mother   . Depression Mother   . Anxiety disorder Mother   . Cancer Father   .  Stroke Sister   . Anxiety disorder Sister   . Depression Sister    Social History:  Social History   Social History  . Marital Status: Single    Spouse Name: N/A  . Number of Children: N/A  . Years of Education: N/A   Social History Main Topics  . Smoking status: Current Every Day Smoker -- 1.00 packs/day    Types: Cigarettes    Start date: 09/16/1980  . Smokeless tobacco: Never Used  . Alcohol Use: No  . Drug Use: No  . Sexual Activity: Not Currently   Other Topics Concern  . None   Social History Narrative   Additional History: Patient has long history of MDD and anxiety. She and reports severe anxiety surrounding her ex and his mother. Reports anxiety about death and dying. States this has become worse in the last couple of months. SHe has never been hospitalized psychiatrically and has never had any suicide attempts.  Assessment:   Musculoskeletal: Strength & Muscle Tone: within normal limits Gait & Station: normal Patient leans: N/A  Psychiatric Specialty Exam: Anxiety      ROS  Blood pressure 120/80, pulse 77, temperature 97.9 F (36.6 C), temperature source Tympanic, height 5' 5.5" (1.664 m), weight 201 lb 6.4 oz (91.354 kg), SpO2 93 %.Body mass index is 32.99 kg/(m^2).  General Appearance: Casual  Eye Contact:  Fair  Speech:  Clear and Coherent  Volume:  Normal  Mood:  improving  Affect:  Better, smiling today  Thought Process:  Coherent  Orientation:  Full (Time, Place, and Person)  Thought Content:  Rumination  Suicidal Thoughts:  No  Homicidal Thoughts:  No  Memory:  Immediate;   Fair Recent;   Fair Remote;   Fair  Judgement:  Fair  Insight:  Fair  Psychomotor Activity:  Normal  Concentration:  Fair  Recall:  [employer] of Knowledge: Fair  Language: Fair  Akathisia:  No  Handed:  Right  AIMS (if indicated):    Assets:  Communication Skills Desire for Improvement Housing  ADL's:  Intact  Cognition: WNL  Sleep:  okay   Is the  patient at risk to self?  No. Has the patient been a risk to self in the past 6 months?  No. Has the patient been a risk to self within the distant past?  No. Is the patient a risk to others?  No. Has the patient been a risk to others in the past 6 months?  No. Has the patient been a risk to others within the distant past?  No.  Current Medications: Current Outpatient Prescriptions  Medication Sig Dispense Refill  . albuterol (PROAIR HFA) 108 (90 Base) MCG/ACT inhaler Inhale into the lungs.    Marland Kitchen atorvastatin (LIPITOR) 40 MG tablet Take 1 tablet by mouth daily.    . budesonide-formoterol (SYMBICORT) 160-4.5 MCG/ACT inhaler Inhale 2 puffs into the lungs 2 (two) times daily.    Marland Kitchen buPROPion (WELLBUTRIN) 75 MG tablet Take 1 tablet (75 mg total) by mouth every morning. 30 tablet 1  . clonazePAM (KLONOPIN) 0.5 MG tablet Take 0.5 mg by mouth.    . hydrOXYzine (VISTARIL) 50 MG capsule Take 1 capsule (50 mg total) by mouth 3 (three) times daily as needed. 30 capsule 0  . Multiple Vitamin tablet Take 1 tablet by mouth daily.    . sertraline (ZOLOFT) 100 MG tablet Take 1 tablet by mouth daily. 30 tablet 1   No current facility-administered medications for this visit.    Medical Decision Making:  Review of Psycho-Social Stressors (1), Established Problem, Worsening (2) and Review of New Medication or Change in Dosage (2)  Treatment Plan Summary:Medication management   MDD Continue Zoloft at 100 mg po daily. Increase Bupropion to 75 mg po qam in the morning. Continue to eat healthy and exercise since it helps with depression and anxiety.  Generalized anxiety disorder Same as above for depression.  Discussed with patient contacting a therapist and work on her existential crisis.  Time spent with patient was 25 minutes and more than half the time was spent in counseling and coordination of care. Return to clinic in 4 weeks time or call before if needed.   Margaret Delgado 12/05/2015, 10:14  AM

## 2016-01-02 ENCOUNTER — Ambulatory Visit: Payer: Medicare Other | Admitting: Psychiatry

## 2016-01-03 ENCOUNTER — Ambulatory Visit: Payer: Medicare Other | Admitting: Psychiatry

## 2016-01-09 ENCOUNTER — Ambulatory Visit (INDEPENDENT_AMBULATORY_CARE_PROVIDER_SITE_OTHER): Payer: Medicare Other | Admitting: Psychiatry

## 2016-01-09 ENCOUNTER — Encounter: Payer: Self-pay | Admitting: Psychiatry

## 2016-01-09 VITALS — BP 110/64 | HR 92 | Temp 98.6°F | Ht 65.5 in | Wt 199.0 lb

## 2016-01-09 DIAGNOSIS — F331 Major depressive disorder, recurrent, moderate: Secondary | ICD-10-CM

## 2016-01-09 MED ORDER — SERTRALINE HCL 100 MG PO TABS: ORAL_TABLET | ORAL | Status: DC

## 2016-01-09 MED ORDER — CLONAZEPAM 0.5 MG PO TABS: 0.5000 mg | ORAL_TABLET | ORAL | Status: DC

## 2016-01-09 NOTE — Progress Notes (Signed)
Patient ID: Margaret Delgado, female   DOB: [birth year], 55 y.o.   MRN: [id number]   [facility] MD/PA/NP OP Progress Note  01/09/2016 1:37 PM OVA GILLENTINE  MRN:  [id number]  Subjective:  Patient is a 55 yo WF with history of MDD who presents for a follow up today. Patient tolerating the increase in Wellbutrin well. States she is at 62 percent of where she needs to be.  She reports having some stressors over the last 2 weeks. States that her sister who recently had a stroke has come to live with her since her husband saw adult son and daughter-in-law have been staying with him. She also reports that her children's father is wanting to come back to the house and states that the house that she lives in is his. States that she does not have the financial capability to pay for housing for herself and HER-2 sons. However she does agree that her depression has improved significantly. She continues to have significant anxiety about death and an existential crisis. She states that she is afraid of what would happen when she died and what would happen to her children. States she obsesses about this all the time.  Sleeping well and eating okay.Denies any suicidal thoughts. Patient stated that she was flying to see her older son in [place]. States that her son was paying for her ticket and her younger son`s ticket. States she is very afraid of flying and would like something to help with her anxiety.   Chief Complaint: Improved mood Chief Complaint    Follow-up; Medication Refill     Visit Diagnosis:     ICD-9-CM ICD-10-CM   1. MDD (major depressive disorder), recurrent episode, moderate ([place]) 296.32 F33.1     Past Medical History:  Past Medical History  Diagnosis Date  . Major depressive disorder, recurrent episode, moderate ([place])   . Panic disorder without agoraphobia   . COPD (chronic obstructive pulmonary disease) ([place])   . Hypercholesterolemia     Past Surgical History  Procedure Laterality Date  .  Cesarean section     Family History:  Family History  Problem Relation Age of Onset  . Breast cancer Paternal Grandmother 35  . Parkinson's disease Mother   . Depression Mother   . Anxiety disorder Mother   . Cancer Father   . Stroke Sister   . Anxiety disorder Sister   . Depression Sister    Social History:  Social History   Social History  . Marital Status: Single    Spouse Name: N/A  . Number of Children: N/A  . Years of Education: N/A   Social History Main Topics  . Smoking status: Current Every Day Smoker -- 1.00 packs/day    Types: Cigarettes    Start date: 09/16/1980  . Smokeless tobacco: Never Used  . Alcohol Use: No  . Drug Use: No  . Sexual Activity: Not Currently   Other Topics Concern  . None   Social History Narrative   Additional History: Patient has long history of MDD and anxiety. She and reports severe anxiety surrounding her ex and his mother. Reports anxiety about death and dying. States this has become worse in the last couple of months. SHe has never been hospitalized psychiatrically and has never had any suicide attempts.  Assessment:   Musculoskeletal: Strength & Muscle Tone: within normal limits Gait & Station: normal Patient leans: N/A  Psychiatric Specialty Exam: Anxiety      ROS  Blood pressure 110/64,  pulse 92, temperature 98.6 F (37 C), temperature source Tympanic, height 5' 5.5" (1.664 m), weight 199 lb (90.266 kg), SpO2 94 %.Body mass index is 32.6 kg/(m^2).  General Appearance: Casual  Eye Contact:  Fair  Speech:  Clear and Coherent  Volume:  Normal  Mood:  improving  Affect:  Better, smiling today  Thought Process:  Coherent  Orientation:  Full (Time, Place, and Person)  Thought Content:  Rumination  Suicidal Thoughts:  No  Homicidal Thoughts:  No  Memory:  Immediate;   Fair Recent;   Fair Remote;   Fair  Judgement:  Fair  Insight:  Fair  Psychomotor Activity:  Normal  Concentration:  Fair  Recall:  [employer]  of Knowledge: Fair  Language: Fair  Akathisia:  No  Handed:  Right  AIMS (if indicated):    Assets:  Communication Skills Desire for Improvement Housing  ADL's:  Intact  Cognition: WNL  Sleep:  okay   Is the patient at risk to self?  No. Has the patient been a risk to self in the past 6 months?  No. Has the patient been a risk to self within the distant past?  No. Is the patient a risk to others?  No. Has the patient been a risk to others in the past 6 months?  No. Has the patient been a risk to others within the distant past?  No.  Current Medications: Current Outpatient Prescriptions  Medication Sig Dispense Refill  . albuterol (PROAIR HFA) 108 (90 Base) MCG/ACT inhaler Inhale into the lungs.    Marland Kitchen atorvastatin (LIPITOR) 40 MG tablet Take 1 tablet by mouth daily.    . budesonide-formoterol (SYMBICORT) 160-4.5 MCG/ACT inhaler Inhale 2 puffs into the lungs 2 (two) times daily.    Marland Kitchen buPROPion (WELLBUTRIN) 75 MG tablet Take 1 tablet (75 mg total) by mouth every morning. 30 tablet 1  . clonazePAM (KLONOPIN) 0.5 MG tablet Take 0.5 mg by mouth.    . hydrOXYzine (VISTARIL) 50 MG capsule Take 1 capsule (50 mg total) by mouth 3 (three) times daily as needed. 30 capsule 0  . Multiple Vitamin tablet Take 1 tablet by mouth daily.    . sertraline (ZOLOFT) 100 MG tablet Take 1 tablet by mouth daily. 30 tablet 1   No current facility-administered medications for this visit.    Medical Decision Making:  Review of Psycho-Social Stressors (1), Established Problem, Worsening (2) and Review of New Medication or Change in Dosage (2)  Treatment Plan Summary:Medication management   MDD Increase Zoloft to 150 mg po daily. Continue Bupropion at 75 mg po qam in the morning. Continue to eat healthy and exercise since it helps with depression and anxiety.  Generalized anxiety disorder Same as above for depression. Take Klonopin at 0.5 mg before flying and then anxious not more than 4 times per week.  A prescription for 15 pills given.  Discussed with patient contacting a therapist and work on her existential crisis.  Time spent with patient was 25 minutes and more than half the time was spent in counseling and coordination of care. Return to clinic in 4 weeks time or call before if needed.   Yehya Brendle 01/09/2016, 1:37 PM

## 2016-01-31 ENCOUNTER — Ambulatory Visit: Payer: Medicare Other | Admitting: Psychiatry

## 2016-02-22 ENCOUNTER — Telehealth: Payer: Self-pay

## 2016-02-22 NOTE — Telephone Encounter (Signed)
left message with the refill information .  ok to refill no additional refills

## 2016-02-22 NOTE — Telephone Encounter (Signed)
received a fax requesting a refill on buspropion hcl 75 mg take 1 tablet by mouth every morning.

## 2016-02-29 ENCOUNTER — Ambulatory Visit (INDEPENDENT_AMBULATORY_CARE_PROVIDER_SITE_OTHER): Payer: Medicare Other | Admitting: Psychiatry

## 2016-02-29 DIAGNOSIS — F411 Generalized anxiety disorder: Secondary | ICD-10-CM

## 2016-02-29 DIAGNOSIS — F331 Major depressive disorder, recurrent, moderate: Secondary | ICD-10-CM

## 2016-02-29 MED ORDER — MIRTAZAPINE 15 MG PO TABS: 15.0000 mg | ORAL_TABLET | Freq: Every day | ORAL | 0 refills | Status: DC

## 2016-02-29 MED ORDER — SERTRALINE HCL 100 MG PO TABS: 100.0000 mg | ORAL_TABLET | Freq: Every day | ORAL | 1 refills | Status: AC

## 2016-02-29 NOTE — Progress Notes (Signed)
Patient ID: Margaret Delgado, female   DOB: 1961/03/21, 55 y.o.   MRN: [id number]   [facility] MD/PA/NP OP Progress Note  02/29/2016 10:57 AM Margaret Delgado  MRN:  [id number]  Subjective:  Patient is a 55 yo WF with history of MDD who presents for a follow up today. She reported that she has been feeling more depressed and anxious and is not sleeping well at night. She takes sertraline 150 mg at night. She reported that she has not been sleeping at all and was awake. The medications are not helping her. She appeared apprehensive during the interview. She stays at home and does not do anything. She denied having any suicidal homicidal ideations or plans. We discussed about the medication adjustment and she is receptive to the changes in her medications.    Chief Complaint:  Chief Complaint    Medication Refill     Visit Diagnosis:   No diagnosis found.  Past Medical History:  Past Medical History:  Diagnosis Date  . COPD (chronic obstructive pulmonary disease) ([place])   . Hypercholesterolemia   . Major depressive disorder, recurrent episode, moderate ([place])   . Panic disorder without agoraphobia     Past Surgical History:  Procedure Laterality Date  . CESAREAN SECTION     Family History:  Family History  Problem Relation Age of Onset  . Breast cancer Paternal Grandmother 2  . Parkinson's disease Mother   . Depression Mother   . Anxiety disorder Mother   . Cancer Father   . Stroke Sister   . Anxiety disorder Sister   . Depression Sister    Social History:  Social History   Social History  . Marital status: Single    Spouse name: N/A  . Number of children: N/A  . Years of education: N/A   Social History Main Topics  . Smoking status: Current Every Day Smoker    Packs/day: 1.00    Types: Cigarettes    Start date: 09/16/1980  . Smokeless tobacco: Never Used  . Alcohol use No  . Drug use: No  . Sexual activity: Not Currently   Other Topics Concern  . Not on file   Social  History Narrative  . No narrative on file   Additional History: Patient has long history of MDD and anxiety. She and reports severe anxiety surrounding her ex and his mother. Reports anxiety about death and dying. States this has become worse in the last couple of months. SHe has never been hospitalized psychiatrically and has never had any suicide attempts.  Assessment:   Musculoskeletal: Strength & Muscle Tone: within normal limits Gait & Station: normal Patient leans: N/A  Psychiatric Specialty Exam: Anxiety     Medication Refill     ROS  There were no vitals taken for this visit.There is no height or weight on file to calculate BMI.  General Appearance: Casual  Eye Contact:  Fair  Speech:  Clear and Coherent  Volume:  Normal  Mood:  improving  Affect:  Better, smiling today  Thought Process:  Coherent  Orientation:  Full (Time, Place, and Person)  Thought Content:  Rumination  Suicidal Thoughts:  No  Homicidal Thoughts:  No  Memory:  Immediate;   Fair Recent;   Fair Remote;   Fair  Judgement:  Fair  Insight:  Fair  Psychomotor Activity:  Normal  Concentration:  Fair  Recall:  [employer] of Knowledge: Fair  Language: Fair  Akathisia:  No  Handed:  Right  AIMS (if indicated):    Assets:  Communication Skills Desire for Improvement Housing  ADL's:  Intact  Cognition: WNL  Sleep:  okay   Is the patient at risk to self?  No. Has the patient been a risk to self in the past 6 months?  No. Has the patient been a risk to self within the distant past?  No. Is the patient a risk to others?  No. Has the patient been a risk to others in the past 6 months?  No. Has the patient been a risk to others within the distant past?  No.  Current Medications: Current Outpatient Prescriptions  Medication Sig Dispense Refill  . albuterol (PROAIR HFA) 108 (90 Base) MCG/ACT inhaler Inhale into the lungs.    Marland Kitchen atorvastatin (LIPITOR) 40 MG tablet Take 1 tablet by mouth daily.     . budesonide-formoterol (SYMBICORT) 160-4.5 MCG/ACT inhaler Inhale 2 puffs into the lungs 2 (two) times daily.    Marland Kitchen buPROPion (WELLBUTRIN) 75 MG tablet Take 1 tablet (75 mg total) by mouth every morning. 30 tablet 1  . clonazePAM (KLONOPIN) 0.5 MG tablet Take 1 tablet (0.5 mg total) by mouth 4 (four) times a week. 15 tablet 0  . hydrOXYzine (VISTARIL) 50 MG capsule Take 1 capsule (50 mg total) by mouth 3 (three) times daily as needed. 30 capsule 0  . Multiple Vitamin tablet Take 1 tablet by mouth daily.    . sertraline (ZOLOFT) 100 MG tablet Take 1-1/2 tablets daily 45 tablet 1   No current facility-administered medications for this visit.     Medical Decision Making:  Review of Psycho-Social Stressors (1), Established Problem, Worsening (2) and Review of New Medication or Change in Dosage (2)  Treatment Plan Summary:Medication management   I will start her on Remeron 15 mg at bedtime to help with her depression and anxiety and she agreed with the plan. She will take Zoloft 100 mg in the morning to help with her depression.  Follow-up in one month or earlier depending on her symptoms   More than 50% of the time spent in psychoeducation, counseling and coordination of care.    This note was generated in part or whole with voice recognition software. Voice regonition is usually quite accurate but there are transcription errors that can and very often do occur. I apologize for any typographical errors that were not detected and corrected.    Rainey Pines, MD 02/29/2016, 10:57 AM

## 2016-03-04 ENCOUNTER — Ambulatory Visit: Payer: Self-pay | Admitting: Psychiatry

## 2016-03-20 ENCOUNTER — Ambulatory Visit: Payer: Self-pay | Admitting: Psychiatry

## 2016-12-04 ENCOUNTER — Other Ambulatory Visit: Payer: Self-pay | Admitting: Family Medicine

## 2016-12-04 DIAGNOSIS — Z1231 Encounter for screening mammogram for malignant neoplasm of breast: Secondary | ICD-10-CM

## 2016-12-25 ENCOUNTER — Ambulatory Visit
Admission: RE | Admit: 2016-12-25 | Discharge: 2016-12-25 | Disposition: A | Discharge status: Home / Self Care | Payer: Medicare Other | Source: Ambulatory Visit | Attending: Family Medicine | Admitting: Family Medicine

## 2016-12-25 DIAGNOSIS — Z1231 Encounter for screening mammogram for malignant neoplasm of breast: Secondary | ICD-10-CM | POA: Diagnosis not present

## 2017-09-04 ENCOUNTER — Other Ambulatory Visit: Payer: Self-pay | Admitting: Family Medicine

## 2017-09-04 ENCOUNTER — Ambulatory Visit
Admission: RE | Admit: 2017-09-04 | Discharge: 2017-09-04 | Disposition: A | Discharge status: Home / Self Care | Payer: Medicare Other | Source: Ambulatory Visit | Attending: Family Medicine | Admitting: Family Medicine

## 2017-09-04 DIAGNOSIS — R05 Cough: Secondary | ICD-10-CM

## 2017-09-04 DIAGNOSIS — R059 Cough, unspecified: Secondary | ICD-10-CM

## 2017-10-28 ENCOUNTER — Encounter: Payer: Self-pay | Admitting: *Deleted

## 2017-11-04 ENCOUNTER — Telehealth: Payer: Self-pay

## 2017-11-04 ENCOUNTER — Other Ambulatory Visit: Payer: Self-pay

## 2017-11-04 DIAGNOSIS — Z1211 Encounter for screening for malignant neoplasm of colon: Secondary | ICD-10-CM

## 2017-11-04 NOTE — Telephone Encounter (Signed)
Gastroenterology Pre-Procedure Review  Request Date: 11/16/17 Requesting Physician: Dr. Vicente Males  PATIENT REVIEW QUESTIONS: The patient responded to the following health history questions as indicated:    1. Are you having any GI issues? no 2. Do you have a personal history of Polyps? no 3. Do you have a family history of Colon Cancer or Polyps? yes (polyps- sister) 4. Diabetes Mellitus? no 5. Joint replacements in the past 12 months?no 6. Major health problems in the past 3 months?no 7. Any artificial heart valves, MVP, or defibrillator?no    MEDICATIONS & ALLERGIES:    Patient reports the following regarding taking any anticoagulation/antiplatelet therapy:   Plavix, Coumadin, Eliquis, Xarelto, Lovenox, Pradaxa, Brilinta, or Effient? no Aspirin? no  Patient confirms/reports the following medications:  Current Outpatient Medications  Medication Sig Dispense Refill  . albuterol (PROAIR HFA) 108 (90 Base) MCG/ACT inhaler Inhale into the lungs.    Marland Kitchen atorvastatin (LIPITOR) 40 MG tablet Take 1 tablet by mouth daily.    . budesonide-formoterol (SYMBICORT) 160-4.5 MCG/ACT inhaler Inhale 2 puffs into the lungs 2 (two) times daily.    . mirtazapine (REMERON) 15 MG tablet Take 1 tablet (15 mg total) by mouth at bedtime. 30 tablet 0  . Multiple Vitamin tablet Take 1 tablet by mouth daily.    . sertraline (ZOLOFT) 100 MG tablet Take 1 tablet (100 mg total) by mouth daily after breakfast. 30 tablet 1   No current facility-administered medications for this visit.     Patient confirms/reports the following allergies:  No Known Allergies  No orders of the defined types were placed in this encounter.   AUTHORIZATION INFORMATION Primary Insurance: 1D#: Group #:  Secondary Insurance: 1D#: Group #:  SCHEDULE INFORMATION: Date: 11/16/17 Time: Location: [facility]

## 2017-11-16 ENCOUNTER — Ambulatory Visit: Admission: RE | Admit: 2017-11-16 | Payer: Medicare Other | Source: Ambulatory Visit | Admitting: Gastroenterology

## 2017-11-16 ENCOUNTER — Encounter: Admission: RE | Payer: Self-pay | Source: Ambulatory Visit

## 2017-11-16 SURGERY — COLONOSCOPY WITH PROPOFOL
Anesthesia: General

## 2018-03-10 ENCOUNTER — Other Ambulatory Visit: Payer: Self-pay | Admitting: Family Medicine

## 2018-03-10 DIAGNOSIS — Z1231 Encounter for screening mammogram for malignant neoplasm of breast: Secondary | ICD-10-CM

## 2018-03-11 ENCOUNTER — Ambulatory Visit
Admission: RE | Admit: 2018-03-11 | Discharge: 2018-03-11 | Disposition: A | Discharge status: Home / Self Care | Payer: Medicare Other | Source: Ambulatory Visit | Attending: Family Medicine | Admitting: Family Medicine

## 2018-03-11 DIAGNOSIS — Z1231 Encounter for screening mammogram for malignant neoplasm of breast: Secondary | ICD-10-CM | POA: Insufficient documentation

## 2020-02-15 ENCOUNTER — Other Ambulatory Visit: Payer: Self-pay | Admitting: Family Medicine

## 2020-02-15 DIAGNOSIS — R140 Abdominal distension (gaseous): Secondary | ICD-10-CM

## 2020-02-17 ENCOUNTER — Other Ambulatory Visit: Payer: Self-pay | Admitting: Family Medicine

## 2020-02-17 DIAGNOSIS — Z1231 Encounter for screening mammogram for malignant neoplasm of breast: Secondary | ICD-10-CM

## 2020-02-21 ENCOUNTER — Ambulatory Visit
Admission: RE | Admit: 2020-02-21 | Discharge: 2020-02-21 | Disposition: A | Discharge status: Home / Self Care | Payer: Medicare Other | Source: Ambulatory Visit | Attending: Family Medicine | Admitting: Family Medicine

## 2020-02-21 ENCOUNTER — Other Ambulatory Visit: Payer: Self-pay

## 2020-02-21 DIAGNOSIS — R140 Abdominal distension (gaseous): Secondary | ICD-10-CM | POA: Diagnosis not present

## 2020-03-07 ENCOUNTER — Ambulatory Visit
Admission: RE | Admit: 2020-03-07 | Discharge: 2020-03-07 | Disposition: A | Discharge status: Home / Self Care | Payer: Medicare Other | Source: Ambulatory Visit | Attending: Family Medicine | Admitting: Family Medicine

## 2020-03-07 ENCOUNTER — Other Ambulatory Visit: Payer: Self-pay

## 2020-03-07 DIAGNOSIS — Z1231 Encounter for screening mammogram for malignant neoplasm of breast: Secondary | ICD-10-CM | POA: Insufficient documentation

## 2020-03-09 ENCOUNTER — Other Ambulatory Visit: Payer: Self-pay | Admitting: Family Medicine

## 2020-03-09 DIAGNOSIS — R928 Other abnormal and inconclusive findings on diagnostic imaging of breast: Secondary | ICD-10-CM

## 2020-03-09 DIAGNOSIS — N6310 Unspecified lump in the right breast, unspecified quadrant: Secondary | ICD-10-CM

## 2020-03-22 ENCOUNTER — Ambulatory Visit
Admission: RE | Admit: 2020-03-22 | Discharge: 2020-03-22 | Disposition: A | Discharge status: Home / Self Care | Payer: Medicare Other | Source: Ambulatory Visit | Attending: Family Medicine | Admitting: Family Medicine

## 2020-03-22 ENCOUNTER — Other Ambulatory Visit: Payer: Self-pay

## 2020-03-22 DIAGNOSIS — R928 Other abnormal and inconclusive findings on diagnostic imaging of breast: Secondary | ICD-10-CM

## 2020-03-22 DIAGNOSIS — N6310 Unspecified lump in the right breast, unspecified quadrant: Secondary | ICD-10-CM | POA: Diagnosis present

## 2020-04-03 ENCOUNTER — Encounter: Payer: Self-pay | Admitting: *Deleted

## 2020-06-11 ENCOUNTER — Telehealth: Payer: Self-pay

## 2020-06-11 DIAGNOSIS — Z122 Encounter for screening for malignant neoplasm of respiratory organs: Secondary | ICD-10-CM

## 2020-06-11 DIAGNOSIS — Z87891 Personal history of nicotine dependence: Secondary | ICD-10-CM

## 2020-06-11 NOTE — Telephone Encounter (Signed)
Contacted patient for lung CT screening program based on referral from Dr. Lennox Grumbles.  I was unable to leave a message as her voice mail box was full on her mobile phone.

## 2020-06-12 ENCOUNTER — Encounter: Payer: Self-pay | Admitting: *Deleted

## 2020-06-12 NOTE — Telephone Encounter (Signed)
Received referral for initial lung cancer screening scan. Contacted patient and obtained smoking history,(current, 53.75 pack year) as well as answering questions related to screening process. Patient denies signs of lung cancer such as weight loss or hemoptysis. Patient denies comorbidity that would prevent curative treatment if lung cancer were found. Patient is scheduled for shared decision making visit and CT scan on 07/10/20 at 145pm.

## 2020-06-12 NOTE — Addendum Note (Signed)
Addended by: Lieutenant Diego on: 06/12/2020 09:21 AM   Modules accepted: Orders

## 2020-07-10 ENCOUNTER — Inpatient Hospital Stay: Payer: Medicare Other | Attending: Nurse Practitioner | Admitting: Nurse Practitioner

## 2020-07-10 ENCOUNTER — Ambulatory Visit
Admission: RE | Admit: 2020-07-10 | Discharge: 2020-07-10 | Disposition: A | Discharge status: Home / Self Care | Payer: Medicare Other | Source: Ambulatory Visit | Attending: Nurse Practitioner | Admitting: Nurse Practitioner

## 2020-07-10 ENCOUNTER — Other Ambulatory Visit: Payer: Self-pay

## 2020-07-10 DIAGNOSIS — F17210 Nicotine dependence, cigarettes, uncomplicated: Secondary | ICD-10-CM | POA: Diagnosis not present

## 2020-07-10 DIAGNOSIS — Z122 Encounter for screening for malignant neoplasm of respiratory organs: Secondary | ICD-10-CM | POA: Diagnosis present

## 2020-07-10 DIAGNOSIS — Z87891 Personal history of nicotine dependence: Secondary | ICD-10-CM | POA: Insufficient documentation

## 2020-07-10 NOTE — Progress Notes (Signed)
Virtual Visit via Video Enabled Telemedicine Note   I connected with Margaret Delgado on 07/10/20 at 1:45 PM EST by video enabled telemedicine visit and verified that I am speaking with the correct person using two identifiers.   I discussed the limitations, risks, security and privacy concerns of performing an evaluation and management service by telemedicine and the availability of in-person appointments. I also discussed with the patient that there may be a patient responsible charge related to this service. The patient expressed understanding and agreed to proceed.   Other persons participating in the visit and their role in the encounter: Burgess Estelle, RN- checking in patient & navigation  Patient's location: [place]  Provider's location: Clinic  Chief Complaint: Low Dose CT Screening  Patient agreed to evaluation by telemedicine to discuss shared decision making for consideration of low dose CT lung cancer screening.    In accordance with CMS guidelines, patient has met eligibility criteria including age, absence of signs or symptoms of lung cancer.  Social History   Tobacco Use  . Smoking status: Current Every Day Smoker    Packs/day: 1.25    Years: 43.00    Pack years: 53.75    Types: Cigarettes  . Smokeless tobacco: Never Used  Substance Use Topics  . Alcohol use: No    Alcohol/week: 0.0 standard drinks     A shared decision-making session was conducted prior to the performance of CT scan. This includes one or more decision aids, includes benefits and harms of screening, follow-up diagnostic testing, over-diagnosis, false positive rate, and total radiation exposure.   Counseling on the importance of adherence to annual lung cancer LDCT screening, impact of co-morbidities, and ability or willingness to undergo diagnosis and treatment is imperative for compliance of the program.   Counseling on the importance of continued smoking cessation for former smokers; the  importance of smoking cessation for current smokers, and information about tobacco cessation interventions have been given to patient including [place] and [employer] [place] programs.   Written order for lung cancer screening with LDCT has been given to the patient and any and all questions have been answered to the best of my abilities.    Yearly follow up will be coordinated by Burgess Estelle, Thoracic Navigator.  I discussed the assessment and treatment plan with the patient. The patient was provided an opportunity to ask questions and all were answered. The patient agreed with the plan and demonstrated an understanding of the instructions.   The patient was advised to call back or seek an in-person evaluation if the symptoms worsen or if the condition fails to improve as anticipated.   I provided 15 minutes of face-to-face video visit time during this encounter, and > 50% was spent counseling as documented under my assessment & plan.   Beckey Rutter, DNP, AGNP-C [place] at [facility] [phone] (clinic)

## 2020-07-12 ENCOUNTER — Telehealth: Payer: Self-pay | Admitting: *Deleted

## 2020-07-12 NOTE — Telephone Encounter (Signed)
Notified patient of LDCT lung cancer screening program results with recommendation for 12 month follow up imaging. Also notified of incidental findings noted below and is encouraged to discuss further with PCP who will receive a copy of this note and/or the CT report. Patient verbalizes understanding.   IMPRESSION: 1. Lung-RADS 2, benign appearance or behavior. Continue annual screening with low-dose chest CT without contrast in 12 months. 2. Two-vessel coronary atherosclerosis. 3. Aortic Atherosclerosis (ICD10-I70.0) and Emphysema (ICD10-J43.9).  

## 2020-08-15 ENCOUNTER — Other Ambulatory Visit: Payer: Self-pay

## 2020-08-15 ENCOUNTER — Encounter: Payer: Self-pay | Admitting: Gastroenterology

## 2020-08-15 ENCOUNTER — Ambulatory Visit (INDEPENDENT_AMBULATORY_CARE_PROVIDER_SITE_OTHER): Payer: Medicare Other | Admitting: Gastroenterology

## 2020-08-15 ENCOUNTER — Ambulatory Visit: Payer: Medicare Other | Admitting: Gastroenterology

## 2020-08-15 VITALS — BP 101/64 | HR 90 | Temp 98.1°F | Ht 65.0 in | Wt 203.8 lb

## 2020-08-15 DIAGNOSIS — Z1211 Encounter for screening for malignant neoplasm of colon: Secondary | ICD-10-CM | POA: Diagnosis not present

## 2020-08-15 DIAGNOSIS — R140 Abdominal distension (gaseous): Secondary | ICD-10-CM

## 2020-08-15 MED ORDER — PEG 3350-KCL-NA BICARB-NACL 420 G PO SOLR: 4000.0000 mL | Freq: Once | ORAL | 0 refills | Status: AC

## 2020-08-15 MED ORDER — PEG 3350-KCL-NA BICARB-NACL 420 G PO SOLR
4000.0000 mL | Freq: Once | ORAL | 0 refills | Status: DC
Start: 2020-08-15 — End: 2020-08-15

## 2020-08-15 NOTE — Progress Notes (Signed)
Jonathon Bellows MD, MRCP(U.K) [address]  [place]  [place], [place] [address]  Main: [phone]  Fax: [phone]   Gastroenterology Consultation  Referring Provider:     [name], MD Primary Care Physician:  [name], MD Primary Gastroenterologist:  Dr. Jonathon Bellows  Reason for Consultation:     Abdominal distention and colon cancer screening        HPI:   Margaret Delgado is a 60 y.o. y/o female referred for consultation & management  by Dr. Lennox Grumbles, Connye Burkitt, MD.    She is here to see me for abdominal distention and colon cancer screening. She says that she has been a smoker her entire life smokes at least a pack a day the past 40 years never had a colonoscopy. A sister has had colon polyps, a second-degree relative passed from colon cancer age of 54. She also complains of abdominal distention for many years. Would like to know why she is so distended. No rectal bleeding no change in bowel habits. She is trying to quit smoking.  Past Medical History:  Diagnosis Date  . COPD (chronic obstructive pulmonary disease) ([place])   . Hypercholesterolemia   . Major depressive disorder, recurrent episode, moderate ([place])   . Panic disorder without agoraphobia     Past Surgical History:  Procedure Laterality Date  . CESAREAN SECTION      Prior to Admission medications   Medication Sig Start Date End Date Taking? Authorizing Provider  albuterol (PROAIR HFA) 108 (90 Base) MCG/ACT inhaler Inhale into the lungs. 10/10/13   [provider]  atorvastatin (LIPITOR) 40 MG tablet Take 1 tablet by mouth daily.    [provider]  budesonide-formoterol (SYMBICORT) 160-4.5 MCG/ACT inhaler Inhale 2 puffs into the lungs 2 (two) times daily.    [provider]  mirtazapine (REMERON) 15 MG tablet Take 1 tablet (15 mg total) by mouth at bedtime. 02/29/16   Rainey Pines, MD  Multiple Vitamin tablet Take 1 tablet by mouth daily.    [provider]  sertraline  (ZOLOFT) 100 MG tablet Take 1 tablet (100 mg total) by mouth daily after breakfast. 02/29/16   Rainey Pines, MD    Family History  Problem Relation Age of Onset  . Parkinson's disease Mother   . Depression Mother   . Anxiety disorder Mother   . Cancer Father   . Stroke Sister   . Anxiety disorder Sister   . Depression Sister   . Breast cancer Paternal Grandmother 64     Social History   Tobacco Use  . Smoking status: Current Every Day Smoker    Packs/day: 1.25    Years: 43.00    Pack years: 53.75    Types: Cigarettes  . Smokeless tobacco: Never Used  Substance Use Topics  . Alcohol use: No    Alcohol/week: 0.0 standard drinks  . Drug use: No    Allergies as of 08/15/2020  . (No Known Allergies)    Review of Systems:    All systems reviewed and negative except where noted in HPI.   Physical Exam:  BP 101/64 (BP Location: Left Arm, Patient Position: Sitting, Cuff Size: Large)   Pulse 90   Temp 98.1 F (36.7 C) (Oral)   Ht 5\' 5"  (1.651 m)   Wt 203 lb 12.8 oz (92.4 kg)   BMI 33.91 kg/m  No LMP recorded. Patient is postmenopausal. Psych:  Alert and cooperative. Normal mood and affect. Nicotine staining on  her fingers General:   Alert,  Well-developed, well-nourished, pleasant and cooperative in NAD Head:  Normocephalic and atraumatic. Eyes:  Sclera clear, no icterus.   Conjunctiva pink. Ears:  Normal auditory acuity. Lungs:  Respirations even and unlabored.  Clear throughout to auscultation.   No wheezes, crackles, or rhonchi. No acute distress. Heart:  Regular rate and rhythm; no murmurs, clicks, rubs, or gallops. Abdomen:  Normal bowel sounds.  No bruits.  Soft, non-tender , generalized distention, without masses, hepatosplenomegaly or hernias noted.  No guarding or rebound tenderness.    Neurologic:  Alert and oriented x3;  grossly normal neurologically. Psych:  Alert and cooperative. Normal mood and affect.  Imaging Studies: No results found.  Assessment and  Plan:   ISA HITZ is a 60 y.o. y/o female has been referred for abdominal distention with no palpable abnormality on examination. We will obtain an ultrasound of the abdomen to rule out any free fluid and any abdominal masses. Colon cancer screening high risk due to family history of polyps. Advised to quit smoking.  I have discussed alternative options, risks & benefits,  which include, but are not limited to, bleeding, infection, perforation,respiratory complication & drug reaction.  The patient agrees with this plan & written consent will be obtained.     Follow up in as needed  Dr Jonathon Bellows MD,MRCP(U.K)

## 2020-08-24 ENCOUNTER — Other Ambulatory Visit: Payer: Self-pay

## 2020-08-24 ENCOUNTER — Telehealth: Payer: Self-pay | Admitting: Gastroenterology

## 2020-08-24 ENCOUNTER — Other Ambulatory Visit
Admission: RE | Admit: 2020-08-24 | Discharge: 2020-08-24 | Disposition: A | Discharge status: Home / Self Care | Payer: Medicare Other | Source: Ambulatory Visit | Attending: Gastroenterology | Admitting: Gastroenterology

## 2020-08-24 DIAGNOSIS — Z01812 Encounter for preprocedural laboratory examination: Secondary | ICD-10-CM | POA: Insufficient documentation

## 2020-08-24 DIAGNOSIS — Z20822 Contact with and (suspected) exposure to covid-19: Secondary | ICD-10-CM | POA: Insufficient documentation

## 2020-08-24 LAB — SARS CORONAVIRUS 2 (TAT 6-24 HRS): SARS Coronavirus 2: NEGATIVE

## 2020-08-24 NOTE — Telephone Encounter (Signed)
Returned patients call to go over prep instructions. Explained to patient drink half the liquid the evening before and drink the remainder 5 hours before procedure. Pt verbalized understanding.

## 2020-08-24 NOTE — Telephone Encounter (Signed)
Patient needs directions on prep. Procedure is Tuesday 08/28/20

## 2020-08-28 ENCOUNTER — Other Ambulatory Visit: Payer: Self-pay

## 2020-08-28 ENCOUNTER — Ambulatory Visit: Payer: Medicare Other | Admitting: Anesthesiology

## 2020-08-28 ENCOUNTER — Encounter: Payer: Self-pay | Admitting: Gastroenterology

## 2020-08-28 ENCOUNTER — Ambulatory Visit
Admission: RE | Admit: 2020-08-28 | Discharge: 2020-08-28 | Disposition: A | Discharge status: Home / Self Care | Payer: Medicare Other | Attending: Gastroenterology | Admitting: Gastroenterology

## 2020-08-28 ENCOUNTER — Encounter
Admission: RE | Disposition: A | Discharge status: Home / Self Care | Payer: Self-pay | Source: Home / Self Care | Attending: Gastroenterology

## 2020-08-28 DIAGNOSIS — Z7951 Long term (current) use of inhaled steroids: Secondary | ICD-10-CM | POA: Diagnosis not present

## 2020-08-28 DIAGNOSIS — D122 Benign neoplasm of ascending colon: Secondary | ICD-10-CM | POA: Insufficient documentation

## 2020-08-28 DIAGNOSIS — Z1211 Encounter for screening for malignant neoplasm of colon: Secondary | ICD-10-CM | POA: Insufficient documentation

## 2020-08-28 DIAGNOSIS — F17210 Nicotine dependence, cigarettes, uncomplicated: Secondary | ICD-10-CM | POA: Insufficient documentation

## 2020-08-28 DIAGNOSIS — Z79899 Other long term (current) drug therapy: Secondary | ICD-10-CM | POA: Insufficient documentation

## 2020-08-28 DIAGNOSIS — Z8371 Family history of colonic polyps: Secondary | ICD-10-CM | POA: Insufficient documentation

## 2020-08-28 DIAGNOSIS — K5730 Diverticulosis of large intestine without perforation or abscess without bleeding: Secondary | ICD-10-CM | POA: Insufficient documentation

## 2020-08-28 DIAGNOSIS — K635 Polyp of colon: Secondary | ICD-10-CM | POA: Insufficient documentation

## 2020-08-28 HISTORY — PX: COLONOSCOPY WITH PROPOFOL: SHX5780

## 2020-08-28 SURGERY — COLONOSCOPY WITH PROPOFOL
Anesthesia: General

## 2020-08-28 MED ORDER — PROPOFOL 10 MG/ML IV BOLUS
INTRAVENOUS | Status: AC
  Filled 2020-08-28: qty 20

## 2020-08-28 MED ORDER — PROPOFOL 10 MG/ML IV BOLUS
INTRAVENOUS | Status: DC | PRN
  Administered 2020-08-28: 100 mg via INTRAVENOUS

## 2020-08-28 MED ORDER — SODIUM CHLORIDE 0.9 % IV SOLN: INTRAVENOUS | Status: DC

## 2020-08-28 MED ORDER — PROPOFOL 500 MG/50ML IV EMUL
INTRAVENOUS | Status: AC
  Filled 2020-08-28: qty 50

## 2020-08-28 MED ORDER — PROPOFOL 500 MG/50ML IV EMUL
INTRAVENOUS | Status: DC | PRN
  Administered 2020-08-28: 150 ug/kg/min via INTRAVENOUS

## 2020-08-28 NOTE — Transfer of Care (Signed)
Immediate Anesthesia Transfer of Care Note  Patient: Margaret Delgado  Procedure(s) Performed: COLONOSCOPY WITH PROPOFOL (N/A )  Patient Location: PACU and Endoscopy Unit  Anesthesia Type:General  Level of Consciousness: drowsy  Airway & Oxygen Therapy: Patient Spontanous Breathing and Patient connected to nasal cannula oxygen  Post-op Assessment: Report given to RN  Post vital signs: stable  Last Vitals:  Vitals Value Taken Time  BP    Temp    Pulse    Resp    SpO2      Last Pain:  Vitals:   08/28/20 0817  TempSrc: Temporal  PainSc: 0-No pain         Complications: No complications documented.

## 2020-08-28 NOTE — Op Note (Signed)
[facility] Gastroenterology Patient Name: Margaret Delgado Procedure Date: 08/28/2020 8:29 AM MRN: [id number] Account #: [id number] Date of Birth: [birth year] Admit Type: Outpatient Age: 60 Room: [facility] ENDO ROOM 2 Gender: Female Note Status: Finalized Procedure:             Colonoscopy Indications:           Colon cancer screening in patient at increased risk:                         Family history of 1st-degree relative with colon polyps Providers:             Jonathon Bellows MD, MD Referring MD:          [name], MD (Referring MD) Medicines:             Monitored Anesthesia Care Complications:         No immediate complications. Procedure:             Pre-Anesthesia Assessment:                        - Prior to the procedure, a History and Physical was                         performed, and patient medications, allergies and                         sensitivities were reviewed. The patient's tolerance                         of previous anesthesia was reviewed.                        - The risks and benefits of the procedure and the                         sedation options and risks were discussed with the                         patient. All questions were answered and informed                         consent was obtained.                        - ASA Grade Assessment: II - A patient with mild                         systemic disease.                        After obtaining informed consent, the colonoscope was                         passed under direct vision. Throughout the procedure,                         the patient's blood pressure, pulse, and oxygen  saturations were monitored continuously. The                         Colonoscope was introduced through the anus and                         advanced to the the cecum, identified by the                         appendiceal orifice. The colonoscopy was performed                         with  ease. The patient tolerated the procedure well.                         The quality of the bowel preparation was good. Findings:      The perianal and digital rectal examinations were normal.      Three sessile polyps were found in the ascending colon. The polyps were       8 to 10 mm in size. These polyps were removed with a cold snare.       Resection and retrieval were complete.      A 5 mm polyp was found in the sigmoid colon. The polyp was sessile. The       polyp was removed with a cold snare. Resection and retrieval were       complete.      Multiple small-mouthed diverticula were found in the entire colon.      The exam was otherwise without abnormality on direct and retroflexion       views. Impression:            - Three 8 to 10 mm polyps in the ascending colon,                         removed with a cold snare. Resected and retrieved.                        - One 5 mm polyp in the sigmoid colon, removed with a                         cold snare. Resected and retrieved.                        - Diverticulosis in the entire examined colon.                        - The examination was otherwise normal on direct and                         retroflexion views. Recommendation:        - Discharge patient to home (with escort).                        - Resume previous diet.                        - Continue present medications.                        -  Await pathology results.                        - Repeat colonoscopy in 3 years for surveillance. Procedure Code(s):     --- Professional ---                        [phone], Colonoscopy, flexible; with removal of                         tumor(s), polyp(s), or other lesion(s) by snare                         technique Diagnosis Code(s):     --- Professional ---                        K63.5, Polyp of colon                        Z83.71, Family history of colonic polyps                        K57.30, Diverticulosis of large intestine  without                         perforation or abscess without bleeding CPT copyright 2019 American Medical Association. All rights reserved. The codes documented in this report are preliminary and upon coder review may  be revised to meet current compliance requirements. Jonathon Bellows, MD Jonathon Bellows MD, MD 08/28/2020 8:58:13 AM This report has been signed electronically. Number of Addenda: 0 Note Initiated On: 08/28/2020 8:29 AM Scope Withdrawal Time: 0 hours 13 minutes 3 seconds  Total Procedure Duration: 0 hours 20 minutes 42 seconds  Estimated Blood Loss:  Estimated blood loss: none.      [facility]

## 2020-08-28 NOTE — H&P (Signed)
Jonathon Bellows, MD [address] [place], [place], [place], [id number], [place], [place], [place], [address] Phone: [phone]  Fax: [phone]  Primary Care Physician:  [name], MD   Pre-Procedure History & Physical: HPI:  Margaret Delgado is a 60 y.o. female is here for an colonoscopy.   Past Medical History:  Diagnosis Date  . COPD (chronic obstructive pulmonary disease) ([place])   . Hypercholesterolemia   . Major depressive disorder, recurrent episode, moderate ([place])   . Panic disorder without agoraphobia     Past Surgical History:  Procedure Laterality Date  . CESAREAN SECTION      Prior to Admission medications   Medication Sig Start Date End Date Taking? Authorizing Provider  albuterol (VENTOLIN HFA) 108 (90 Base) MCG/ACT inhaler Inhale into the lungs. 10/10/13  Yes [provider]  atorvastatin (LIPITOR) 40 MG tablet Take 1 tablet by mouth daily.   Yes [provider]  budesonide-formoterol (SYMBICORT) 160-4.5 MCG/ACT inhaler Inhale 2 puffs into the lungs 2 (two) times daily.   Yes [provider]  buPROPion (WELLBUTRIN XL) 300 MG 24 hr tablet Take 300 mg by mouth at bedtime. 07/05/20  Yes [provider]  citalopram (CELEXA) 40 MG tablet Take 40 mg by mouth daily. 07/05/20  Yes [provider]  LORazepam (ATIVAN) 1 MG tablet Take 1 mg by mouth 3 (three) times daily. 07/31/20  Yes [provider]  Multiple Vitamin tablet Take 1 tablet by mouth daily.   Yes [provider]  mirtazapine (REMERON) 15 MG tablet Take 1 tablet (15 mg total) by mouth at bedtime. Patient not taking: Reported on 08/28/2020 02/29/16   Rainey Pines, MD  sertraline (ZOLOFT) 100 MG tablet Take 1 tablet (100 mg total) by mouth daily after breakfast. Patient not taking: No sig reported 02/29/16   Rainey Pines, MD    Allergies as of 08/15/2020  . (No Known Allergies)    Family History  Problem Relation Age of Onset  .  Parkinson's disease Mother   . Depression Mother   . Anxiety disorder Mother   . Cancer Father   . Stroke Sister   . Anxiety disorder Sister   . Depression Sister   . Breast cancer Paternal Grandmother 53    Social History   Socioeconomic History  . Marital status: Single    Spouse name: Not on file  . Number of children: Not on file  . Years of education: Not on file  . Highest education level: Not on file  Occupational History  . Not on file  Tobacco Use  . Smoking status: Current Every Day Smoker    Packs/day: 1.25    Years: 43.00    Pack years: 53.75    Types: Cigarettes  . Smokeless tobacco: Never Used  Vaping Use  . Vaping Use: Never used  Substance and Sexual Activity  . Alcohol use: No    Alcohol/week: 0.0 standard drinks  . Drug use: No  . Sexual activity: Not Currently  Other Topics Concern  . Not on file  Social History Narrative  . Not on file   Social Determinants of Health   Financial Resource Strain: Not on file  Food Insecurity: Not on file  Transportation Needs: Not on file  Physical Activity: Not on file  Stress: Not on file  Social Connections: Not on file  Intimate Partner Violence: Not on file    Review of Systems: See HPI, otherwise negative ROS  Physical Exam: BP 126/62   Pulse 77   Temp (!) 96.7 F (35.9 C) (Temporal)   Resp 18   Ht 5' 5.5" (1.664 m)   Wt 92.5 kg   SpO2 98%   BMI 33.43 kg/m  General:   Alert,  pleasant and cooperative in NAD Head:  Normocephalic and atraumatic. Neck:  Supple; no masses or thyromegaly. Lungs:  Clear throughout to auscultation, normal respiratory effort.    Heart:  +S1, +S2, Regular rate and rhythm, No edema. Abdomen:  Soft, nontender and nondistended. Normal bowel sounds, without guarding, and without rebound.   Neurologic:  Alert and  oriented x4;  grossly normal neurologically.  Impression/Plan: Margaret Delgado is here for an colonoscopy to be performed for Screening colonoscopy with  family history of colon polyps Risks, benefits, limitations, and alternatives regarding  colonoscopy have been reviewed with the patient.  Questions have been answered.  All parties agreeable.   Jonathon Bellows, MD  08/28/2020, 8:29 AM

## 2020-08-28 NOTE — Anesthesia Postprocedure Evaluation (Signed)
Anesthesia Post Note  Patient: Margaret Delgado  Procedure(s) Performed: COLONOSCOPY WITH PROPOFOL (N/A )  Patient location during evaluation: Endoscopy Anesthesia Type: General Level of consciousness: awake and alert Pain management: pain level controlled Vital Signs Assessment: post-procedure vital signs reviewed and stable Respiratory status: spontaneous breathing, nonlabored ventilation, respiratory function stable and patient connected to nasal cannula oxygen Cardiovascular status: blood pressure returned to baseline and stable Postop Assessment: no apparent nausea or vomiting Anesthetic complications: no   No complications documented.   Last Vitals:  Vitals:   08/28/20 0918 08/28/20 0928  BP: 113/65 110/67  Pulse: 62 (!) 57  Resp: 15 13  Temp:    SpO2: 94% 97%    Last Pain:  Vitals:   08/28/20 0928  TempSrc:   PainSc: 0-No pain                 Precious Haws Piscitello

## 2020-08-28 NOTE — Anesthesia Preprocedure Evaluation (Addendum)
Anesthesia Evaluation  Patient identified by MRN, date of birth, ID band Patient awake    Reviewed: Allergy & Precautions, H&P , NPO status , Patient's Chart, lab work & pertinent test results  History of Anesthesia Complications Negative for: history of anesthetic complications  Airway Mallampati: III  TM Distance: <3 FB Neck ROM: full    Dental  (+) Chipped, Poor Dentition, Missing, Loose   Pulmonary neg shortness of breath, COPD, Current Smoker,    Pulmonary exam normal        Cardiovascular Exercise Tolerance: Good hypertension, (-) angina+ Peripheral Vascular Disease  (-) PND Normal cardiovascular exam     Neuro/Psych PSYCHIATRIC DISORDERS negative neurological ROS     GI/Hepatic negative GI ROS, Neg liver ROS, neg GERD  ,  Endo/Other  negative endocrine ROS  Renal/GU negative Renal ROS  negative genitourinary   Musculoskeletal   Abdominal   Peds  Hematology negative hematology ROS (+)   Anesthesia Other Findings Past Medical History: No date: COPD (chronic obstructive pulmonary disease) ([facility]) No date: Hypercholesterolemia No date: Major depressive disorder, recurrent episode, moderate ([facility]) No date: Panic disorder without agoraphobia  Past Surgical History: No date: CESAREAN SECTION  BMI    Body Mass Index: 33.43 kg/m      Reproductive/Obstetrics negative OB ROS                          Anesthesia Physical Anesthesia Plan  ASA: III  Anesthesia Plan: General   Post-op Pain Management:    Induction: Intravenous  PONV Risk Score and Plan: Propofol infusion and TIVA  Airway Management Planned: Natural Airway and Nasal Cannula  Additional Equipment:   Intra-op Plan:   Post-operative Plan:   Informed Consent: I have reviewed the patients History and Physical, chart, labs and discussed the procedure including the risks, benefits and alternatives for the proposed  anesthesia with the patient or authorized representative who has indicated his/her understanding and acceptance.     Dental Advisory Given  Plan Discussed with: Anesthesiologist, CRNA and Surgeon  Anesthesia Plan Comments: (Patient consented for risks of anesthesia including but not limited to:  - adverse reactions to medications - risk of airway placement if required - damage to eyes, teeth, lips or other oral mucosa - nerve damage due to positioning  - sore throat or hoarseness - Damage to heart, brain, nerves, lungs, other parts of body or loss of life  Patient voiced understanding.)        Anesthesia Quick Evaluation

## 2020-08-29 ENCOUNTER — Encounter: Payer: Self-pay | Admitting: Gastroenterology

## 2020-08-29 LAB — SURGICAL PATHOLOGY

## 2020-09-02 ENCOUNTER — Encounter: Payer: Self-pay | Admitting: Gastroenterology

## 2020-11-12 ENCOUNTER — Telehealth: Payer: Self-pay

## 2020-11-12 NOTE — Telephone Encounter (Signed)
[facility] referring for Further eval and treatment for large bladder prolapse, due for papsmear paper records. Voicemail not accepting messages at this time.

## 2020-11-27 ENCOUNTER — Other Ambulatory Visit: Payer: Self-pay

## 2020-11-27 ENCOUNTER — Other Ambulatory Visit (HOSPITAL_COMMUNITY)
Admission: RE | Admit: 2020-11-27 | Discharge: 2020-11-27 | Disposition: A | Discharge status: Home / Self Care | Payer: Medicare Other | Source: Ambulatory Visit | Attending: Obstetrics and Gynecology | Admitting: Obstetrics and Gynecology

## 2020-11-27 ENCOUNTER — Encounter: Payer: Self-pay | Admitting: Obstetrics and Gynecology

## 2020-11-27 ENCOUNTER — Ambulatory Visit (INDEPENDENT_AMBULATORY_CARE_PROVIDER_SITE_OTHER): Payer: Medicare Other | Admitting: Obstetrics and Gynecology

## 2020-11-27 VITALS — BP 100/70 | Ht 65.5 in | Wt 203.0 lb

## 2020-11-27 DIAGNOSIS — Z1151 Encounter for screening for human papillomavirus (HPV): Secondary | ICD-10-CM | POA: Diagnosis not present

## 2020-11-27 DIAGNOSIS — R140 Abdominal distension (gaseous): Secondary | ICD-10-CM

## 2020-11-27 DIAGNOSIS — Z124 Encounter for screening for malignant neoplasm of cervix: Secondary | ICD-10-CM

## 2020-11-27 NOTE — Progress Notes (Signed)
Patient ID: Margaret Delgado, female   DOB: February 20, 1961, 60 y.o.   MRN: [id number]  Reason for Consult: Vaginal Prolapse   Referred by [name], MD  Subjective:     HPI:  Margaret Delgado is a 60 y.o. female.  She presents today with several concerns.  She reports that she is having increased frequency of urination.  She reports that she will urinate 25 times during the day and 2 times overnight.  She reports that she has been wearing an incontinence pad because of leakage of urine.  She frequently has the urge to go to the bathroom.  She reports that she generally does not have accidents when she is at home.  Whenever she goes out she will have more frequent accidents and sometimes 3 a day.  She feels like she is able to empty her bladder.  She reports that she consumes generally large amounts of water and decaffeinated coffee.  She also reports that she has noticed tissue prolapsing from her vagina.  She refers to it as a chicken snack that she is sticking out.  She denies any pressure or pain secondary to the prolapse.  She denies any issues with constipation.  She does note that she has a remote history of uterine rupture with her last child.  She reports that after a vaginal exam where she felt a pop she had heavy bleeding and pain and had to have an emergency C-section secondary to a uterine rupture.  All of her prior four deliveries were vaginal.  Gynecological History  No LMP recorded. Patient is postmenopausal. Menarche: 15 Menopause: 42  History of fibroids, polyps, or ovarian cysts? : no  History of PCOS? no Hstory of Endometriosis? no History of abnormal pap smears? Yes, very remote history of cryotherapy Have you had any sexually transmitted infections in the past? no  Last Pap:3 years ago per patient normal  She identifies as a femlae. She is nosexually active.   She denies dyspareunia. She denies postcoital bleeding.  She currently uses abstinence for contraception.    Obstetrical History OB History  Gravida Para Term Preterm AB Living  5 5 5     5   SAB IAB Ectopic Multiple Live Births               # Outcome Date GA Lbr Len/2nd Weight Sex Delivery Anes PTL Lv  5 Term 06/06/99     CS-Unspec     4 Term 07/24/97     Vag-Spont     3 Term 11/16/86     Vag-Spont     2 Term 10/17/83     Vag-Spont     1 Term 11/10/78     Vag-Spont        Past Medical History:  Diagnosis Date  . COPD (chronic obstructive pulmonary disease) ([place])   . Hypercholesterolemia   . Major depressive disorder, recurrent episode, moderate ([place])   . Panic disorder without agoraphobia    Family History  Problem Relation Age of Onset  . Parkinson's disease Mother   . Depression Mother   . Anxiety disorder Mother   . Cancer Father   . Stroke Sister   . Anxiety disorder Sister   . Depression Sister   . Breast cancer Paternal Grandmother 66   Past Surgical History:  Procedure Laterality Date  . CESAREAN SECTION    . COLONOSCOPY WITH PROPOFOL N/A 08/28/2020   Procedure: COLONOSCOPY WITH PROPOFOL;  Surgeon: Jonathon Bellows, MD;  Location: [facility] ENDOSCOPY;  Service: Gastroenterology;  Laterality: N/A;    Short Social History:  Social History   Tobacco Use  . Smoking status: Current Every Day Smoker    Packs/day: 1.25    Years: 43.00    Pack years: 53.75    Types: Cigarettes  . Smokeless tobacco: Never Used  Substance Use Topics  . Alcohol use: No    Alcohol/week: 0.0 standard drinks    No Known Allergies  Current Outpatient Medications  Medication Sig Dispense Refill  . albuterol (VENTOLIN HFA) 108 (90 Base) MCG/ACT inhaler Inhale into the lungs.    Marland Kitchen atorvastatin (LIPITOR) 40 MG tablet Take 1 tablet by mouth daily.    . budesonide-formoterol (SYMBICORT) 160-4.5 MCG/ACT inhaler Inhale 2 puffs into the lungs 2 (two) times daily.    Marland Kitchen buPROPion (WELLBUTRIN XL) 300 MG 24 hr tablet Take 300 mg by mouth at bedtime.    . citalopram (CELEXA) 40 MG tablet Take 40 mg by  mouth daily.    Marland Kitchen LORazepam (ATIVAN) 1 MG tablet Take 1 mg by mouth 3 (three) times daily.    . mirtazapine (REMERON) 15 MG tablet Take 1 tablet (15 mg total) by mouth at bedtime. (Patient not taking: No sig reported) 30 tablet 0  . Multiple Vitamin tablet Take 1 tablet by mouth daily. (Patient not taking: Reported on 11/27/2020)    . sertraline (ZOLOFT) 100 MG tablet Take 1 tablet (100 mg total) by mouth daily after breakfast. (Patient not taking: No sig reported) 30 tablet 1   No current facility-administered medications for this visit.    Review of Systems  Constitutional: Negative for chills, fatigue, fever and unexpected weight change.  HENT: Negative for trouble swallowing.  Eyes: Negative for loss of vision.  Respiratory: Negative for cough, shortness of breath and wheezing.  Cardiovascular: Negative for chest pain, leg swelling, palpitations and syncope.  GI: Negative for abdominal pain, blood in stool, diarrhea, nausea and vomiting.  GU: Positive for frequency. Negative for difficulty urinating, dysuria and hematuria.  Musculoskeletal: Negative for back pain, leg pain and joint pain.  Skin: Negative for rash.  Neurological: Negative for dizziness, headaches, light-headedness, numbness and seizures.  Psychiatric: Negative for behavioral problem, confusion, depressed mood and sleep disturbance.        Objective:  Objective   Vitals:   11/27/20 1026  BP: 100/70  Weight: 203 lb (92.1 kg)  Height: 5' 5.5" (1.664 m)   Body mass index is 33.27 kg/m.  Physical Exam Vitals and nursing note reviewed. Exam conducted with a chaperone present.  Constitutional:      Appearance: Normal appearance. She is well-developed.  HENT:     Head: Normocephalic and atraumatic.  Eyes:     Extraocular Movements: Extraocular movements intact.     Pupils: Pupils are equal, round, and reactive to light.  Cardiovascular:     Rate and Rhythm: Normal rate and regular rhythm.  Pulmonary:      Effort: Pulmonary effort is normal. No respiratory distress.     Breath sounds: Normal breath sounds.  Abdominal:     General: Abdomen is flat.     Palpations: Abdomen is soft.  Genitourinary:    Comments: External: Normal appearing vulva. No lesions noted.  Speculum examination: Normal appearing cervix. No blood in the vaginal vault. No discharge.   Bimanual examination: Uterus midline, non-tender, normal in size, shape and contour.  No CMT. No adnexal masses. No adnexal tenderness. Pelvis not fixed.  Musculoskeletal:  General: No signs of injury.  Skin:    General: Skin is warm and dry.  Neurological:     Mental Status: She is alert and oriented to person, place, and time.  Psychiatric:        Behavior: Behavior normal.        Thought Content: Thought content normal.        Judgment: Judgment normal.   POP-Q exam:      Aa = -2 Ba = -2 C = 5  gH = 5 pb = 3 TVL = 10  Ap = 0 BP = +1 D = 7   Summary statement of POP-Q exam:    the anterior vaginal wall is 2 cm above the level of the hymen the cuff / cervix is 5 cm above the level of the hymen,  and the posterior vaginal wall is 1cm in front of the level of the hymen   Assessment/Plan:    60 yo 1. Rectocele, stage 2 prolapse. Discussed management options and provided the patient with [facility] handout. She is not bothered by the prolapse does not desire treatment at this time 2. Urinary incontinence. Symptoms suggestive of mixed incontinence. Will have patient complete a bladder diary. She will return for a early appointment with a full bladder so we can do a cough test, a PVR and send a urine culture. She reports success in symptom management with vesicare in the past. May need urodynamic testing. Provided with [facility] handouts on stress and urge incontinence.  3. Cervical cancer screening- pap today  More than 45 minutes were spent face to face with the patient in the room, reviewing the medical record, labs and images, and  coordinating care for the patient. The plan of management was discussed in detail and counseling was provided.    Adrian Prows MD [facility] 11/27/2020 10:47 AM

## 2020-11-29 LAB — CYTOLOGY - PAP
Comment: NEGATIVE
Diagnosis: NEGATIVE
High risk HPV: NEGATIVE

## 2020-12-04 ENCOUNTER — Ambulatory Visit: Payer: Medicare Other

## 2020-12-11 ENCOUNTER — Ambulatory Visit: Payer: Medicare Other | Admitting: Obstetrics and Gynecology

## 2020-12-12 ENCOUNTER — Ambulatory Visit: Payer: Medicare Other | Admitting: Obstetrics and Gynecology

## 2021-01-11 ENCOUNTER — Other Ambulatory Visit: Payer: Self-pay

## 2021-01-11 ENCOUNTER — Ambulatory Visit
Admission: RE | Admit: 2021-01-11 | Discharge: 2021-01-11 | Disposition: A | Discharge status: Home / Self Care | Payer: Medicare Other | Source: Ambulatory Visit | Attending: Obstetrics and Gynecology | Admitting: Obstetrics and Gynecology

## 2021-01-11 DIAGNOSIS — R140 Abdominal distension (gaseous): Secondary | ICD-10-CM

## 2021-01-14 ENCOUNTER — Ambulatory Visit: Payer: Medicare Other | Admitting: Obstetrics and Gynecology

## 2021-02-01 ENCOUNTER — Telehealth: Payer: Self-pay

## 2021-02-01 NOTE — Telephone Encounter (Signed)
Patient had u/s 6/17 to check for fluid. Patient reports it showed no fluid. Requesting further testing (CT/MRI) she's concerned. She has pain when she presses on certain spots. [phone]

## 2021-02-01 NOTE — Telephone Encounter (Signed)
Pt left msg on triage saying she didn't get to her phone on time. Did you try calling her by any chance?

## 2021-02-01 NOTE — Telephone Encounter (Signed)
Spoke w/patient. Advised Dr. Gilman Schmidt is out of the office until Tuesday. Message will be sent for her review.

## 2021-02-05 ENCOUNTER — Ambulatory Visit: Payer: Medicare Other | Admitting: Obstetrics and Gynecology

## 2021-02-05 NOTE — Telephone Encounter (Signed)
Pt calling; wants [facility] to order her a CT scan to find out why her stomach is so bloated she looks like she is 38m preg; u/s didn't show fluid; pt wants CT Scan ordered.  [phone]

## 2021-02-21 ENCOUNTER — Other Ambulatory Visit: Payer: Self-pay | Admitting: Obstetrics and Gynecology

## 2021-02-21 DIAGNOSIS — R140 Abdominal distension (gaseous): Secondary | ICD-10-CM

## 2021-02-21 NOTE — Telephone Encounter (Signed)
I placed the CT order for this for the patient

## 2021-03-11 ENCOUNTER — Ambulatory Visit
Admission: RE | Admit: 2021-03-11 | Discharge: 2021-03-11 | Disposition: A | Discharge status: Home / Self Care | Payer: Medicare Other | Source: Ambulatory Visit | Attending: Obstetrics and Gynecology | Admitting: Obstetrics and Gynecology

## 2021-03-11 ENCOUNTER — Other Ambulatory Visit: Payer: Self-pay

## 2021-03-11 DIAGNOSIS — K5730 Diverticulosis of large intestine without perforation or abscess without bleeding: Secondary | ICD-10-CM | POA: Diagnosis not present

## 2021-03-11 DIAGNOSIS — I700 Atherosclerosis of aorta: Secondary | ICD-10-CM | POA: Insufficient documentation

## 2021-03-11 DIAGNOSIS — R140 Abdominal distension (gaseous): Secondary | ICD-10-CM | POA: Diagnosis not present

## 2021-03-11 LAB — POCT I-STAT CREATININE: Creatinine, Ser: 0.9 mg/dL (ref 0.44–1.00)

## 2021-03-11 MED ORDER — IOHEXOL 350 MG/ML SOLN
100.0000 mL | Freq: Once | INTRAVENOUS | Status: AC | PRN
  Administered 2021-03-11: 100 mL via INTRAVENOUS

## 2021-03-13 ENCOUNTER — Ambulatory Visit (INDEPENDENT_AMBULATORY_CARE_PROVIDER_SITE_OTHER): Payer: Medicare Other | Admitting: Obstetrics and Gynecology

## 2021-03-13 ENCOUNTER — Other Ambulatory Visit: Payer: Self-pay

## 2021-03-13 VITALS — BP 120/70 | Ht 67.0 in | Wt 206.4 lb

## 2021-03-13 DIAGNOSIS — R140 Abdominal distension (gaseous): Secondary | ICD-10-CM

## 2021-03-13 NOTE — Progress Notes (Signed)
Patient ID: Margaret Delgado, female   DOB: 12/02/1960, 60 y.o.   MRN: [id number]  Reason for Consult: Gynecologic Exam   Referred by [name], MD  Subjective:     HPI:  Margaret Delgado is a 60 y.o. female.  She presents today for CT scan follow-up.  She has no new complaints.  Gynecological History  No LMP recorded. Patient is postmenopausal.  Past Medical History:  Diagnosis Date   COPD (chronic obstructive pulmonary disease) ([place])    Hypercholesterolemia    Major depressive disorder, recurrent episode, moderate ([facility])    Panic disorder without agoraphobia    Family History  Problem Relation Age of Onset   Parkinson's disease Mother    Depression Mother    Anxiety disorder Mother    Cancer Father    Stroke Sister    Anxiety disorder Sister    Depression Sister    Breast cancer Paternal Grandmother 81   Past Surgical History:  Procedure Laterality Date   CESAREAN SECTION     COLONOSCOPY WITH PROPOFOL N/A 08/28/2020   Procedure: COLONOSCOPY WITH PROPOFOL;  Surgeon: Jonathon Bellows, MD;  Location: [facility] ENDOSCOPY;  Service: Gastroenterology;  Laterality: N/A;    Short Social History:  Social History   Tobacco Use   Smoking status: Every Day    Packs/day: 1.25    Years: 43.00    Pack years: 53.75    Types: Cigarettes   Smokeless tobacco: Never  Substance Use Topics   Alcohol use: No    Alcohol/week: 0.0 standard drinks    No Known Allergies  Current Outpatient Medications  Medication Sig Dispense Refill   budesonide-formoterol (SYMBICORT) 160-4.5 MCG/ACT inhaler Inhale 2 puffs into the lungs 2 (two) times daily.     buPROPion (WELLBUTRIN XL) 300 MG 24 hr tablet Take 300 mg by mouth at bedtime.     citalopram (CELEXA) 40 MG tablet Take 40 mg by mouth daily.     LORazepam (ATIVAN) 1 MG tablet Take 1 mg by mouth 3 (three) times daily.     albuterol (VENTOLIN HFA) 108 (90 Base) MCG/ACT inhaler Inhale into the lungs.     atorvastatin (LIPITOR) 40 MG tablet Take 1  tablet by mouth daily.     mirtazapine (REMERON) 15 MG tablet Take 1 tablet (15 mg total) by mouth at bedtime. (Patient not taking: No sig reported) 30 tablet 0   Multiple Vitamin tablet Take 1 tablet by mouth daily. (Patient not taking: Reported on 11/27/2020)     sertraline (ZOLOFT) 100 MG tablet Take 1 tablet (100 mg total) by mouth daily after breakfast. (Patient not taking: No sig reported) 30 tablet 1   No current facility-administered medications for this visit.    Review of Systems  Constitutional: Negative for chills, fatigue, fever and unexpected weight change.  HENT: Negative for trouble swallowing.  Eyes: Negative for loss of vision.  Respiratory: Negative for cough, shortness of breath and wheezing.  Cardiovascular: Negative for chest pain, leg swelling, palpitations and syncope.  GI: Negative for abdominal pain, blood in stool, diarrhea, nausea and vomiting.  GU: Negative for difficulty urinating, dysuria, frequency and hematuria.  Musculoskeletal: Negative for back pain, leg pain and joint pain.  Skin: Negative for rash.  Neurological: Negative for dizziness, headaches, light-headedness, numbness and seizures.  Psychiatric: Negative for behavioral problem, confusion, depressed mood and sleep disturbance.       Objective:  Objective   Vitals:   03/13/21 1126  BP: 120/70  Weight: 206  lb 6.4 oz (93.6 kg)  Height: '5\' 7"'$  (1.702 m)   Body mass index is 32.33 kg/m.  Physical Exam Vitals and nursing note reviewed. Exam conducted with a chaperone present.  Constitutional:      Appearance: Normal appearance.  HENT:     Head: Normocephalic and atraumatic.  Eyes:     Extraocular Movements: Extraocular movements intact.     Pupils: Pupils are equal, round, and reactive to light.  Cardiovascular:     Rate and Rhythm: Normal rate and regular rhythm.  Pulmonary:     Effort: Pulmonary effort is normal.     Breath sounds: Normal breath sounds.  Abdominal:     General:  Abdomen is flat.     Palpations: Abdomen is soft.  Musculoskeletal:     Cervical back: Normal range of motion.  Skin:    General: Skin is warm and dry.  Neurological:     General: No focal deficit present.     Mental Status: She is alert and oriented to person, Delgado, and time.  Psychiatric:        Behavior: Behavior normal.        Thought Content: Thought content normal.        Judgment: Judgment normal.    Assessment/Plan:     60 year old We reviewed the results of her CT scan.  We discussed that it showed that she had diverticulosis as well as aortic atherosclerosis.  She has follow-up for cardiac stress testing planned for next week.  She will discuss this finding with her cardiologist and her primary care physician.  She reports that she has been suffering with anxiety especially since moving to an apartment.  She was drinking heavily but had success in quitting after speaking with her primary care physician.  She notes that however that she has gained 40 pounds recently.  She tends to eat late at night and has been indulging in sugar-free ice cream.  She has resources for gym membership's and is familiar with healthy eating options and feels motivated for lifestyle changes.  Provided her with [facility] handouts regarding health restoration.  More than 20 minutes were spent face to face with the patient in the room, reviewing the medical record, labs and images, and coordinating care for the patient. The plan of management was discussed in detail and counseling was provided.       Adrian Prows MD [facility] 03/13/2021 12:29 PM

## 2021-05-20 ENCOUNTER — Other Ambulatory Visit: Payer: Self-pay | Admitting: Family Medicine

## 2021-05-20 DIAGNOSIS — Z1231 Encounter for screening mammogram for malignant neoplasm of breast: Secondary | ICD-10-CM

## 2021-06-18 ENCOUNTER — Other Ambulatory Visit: Payer: Self-pay | Admitting: *Deleted

## 2021-06-18 DIAGNOSIS — F17210 Nicotine dependence, cigarettes, uncomplicated: Secondary | ICD-10-CM

## 2021-06-18 DIAGNOSIS — Z87891 Personal history of nicotine dependence: Secondary | ICD-10-CM

## 2021-07-12 ENCOUNTER — Other Ambulatory Visit: Payer: Self-pay

## 2021-07-12 ENCOUNTER — Ambulatory Visit
Admission: RE | Admit: 2021-07-12 | Discharge: 2021-07-12 | Disposition: A | Discharge status: Home / Self Care | Payer: Medicare Other | Source: Ambulatory Visit | Attending: Acute Care | Admitting: Acute Care

## 2021-07-12 DIAGNOSIS — F17210 Nicotine dependence, cigarettes, uncomplicated: Secondary | ICD-10-CM | POA: Diagnosis not present

## 2021-07-12 DIAGNOSIS — Z87891 Personal history of nicotine dependence: Secondary | ICD-10-CM | POA: Insufficient documentation

## 2021-07-17 ENCOUNTER — Other Ambulatory Visit: Payer: Self-pay | Admitting: Acute Care

## 2021-07-17 DIAGNOSIS — Z87891 Personal history of nicotine dependence: Secondary | ICD-10-CM

## 2021-07-17 DIAGNOSIS — F17210 Nicotine dependence, cigarettes, uncomplicated: Secondary | ICD-10-CM

## 2021-08-02 ENCOUNTER — Ambulatory Visit
Admission: RE | Admit: 2021-08-02 | Discharge: 2021-08-02 | Disposition: A | Discharge status: Home / Self Care | Payer: Medicare Other | Source: Ambulatory Visit | Attending: Family Medicine | Admitting: Family Medicine

## 2021-08-02 ENCOUNTER — Other Ambulatory Visit: Payer: Self-pay

## 2021-08-02 DIAGNOSIS — Z1231 Encounter for screening mammogram for malignant neoplasm of breast: Secondary | ICD-10-CM | POA: Insufficient documentation

## 2021-08-27 IMAGING — CT CT CHEST LUNG CANCER SCREENING LOW DOSE W/O CM
2 of 5 series · 15 of 40 positions shown, 18 images · non-contrast
Comparison: 07/17/2014 chest CT angiogram.

CLINICAL DATA: 59-year-old asymptomatic female current smoker with
53.75 pack-year smoking history.

EXAM:
CT CHEST WITHOUT CONTRAST LOW-DOSE FOR LUNG CANCER SCREENING
TECHNIQUE: Multidetector CT imaging of the chest was performed following the
standard protocol without IV contrast.

[Series 3: lung 1.00 · axial · 0.81mm/px · z∈[-1235,-927]mm · 12 of 342 slices shown, 15 images]
[im 17/342  mediastinal]
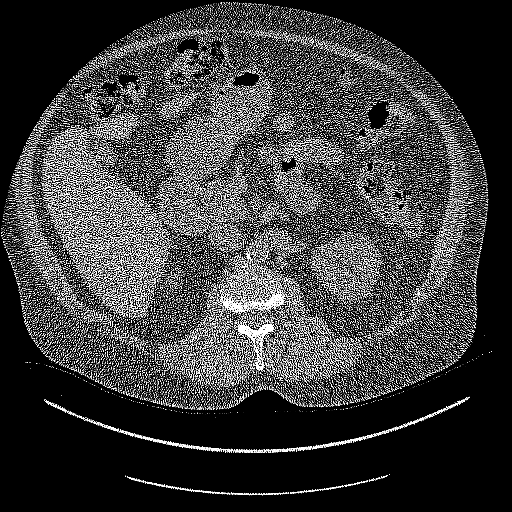
[im 17/342  lung]
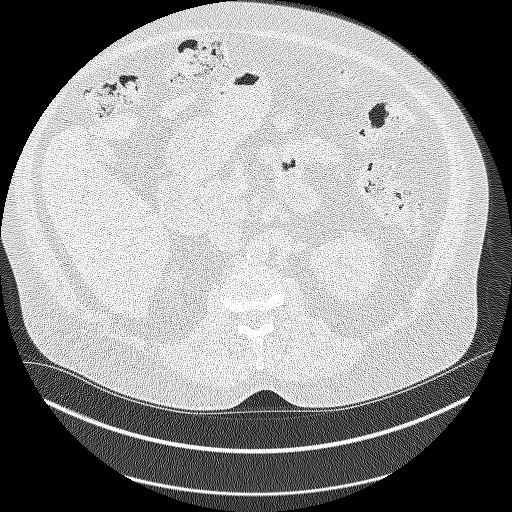
[im 49/342  lung]
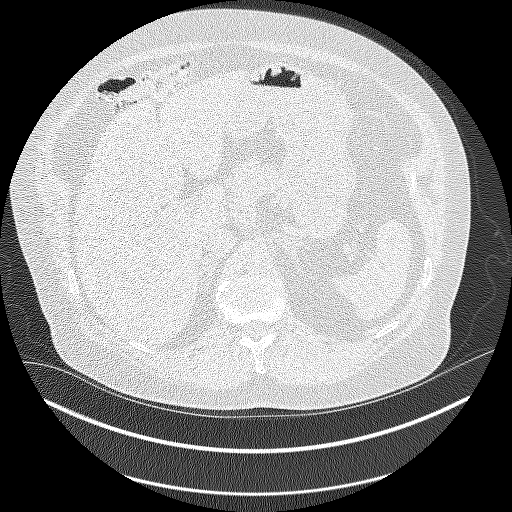
[im 82/342  lung]
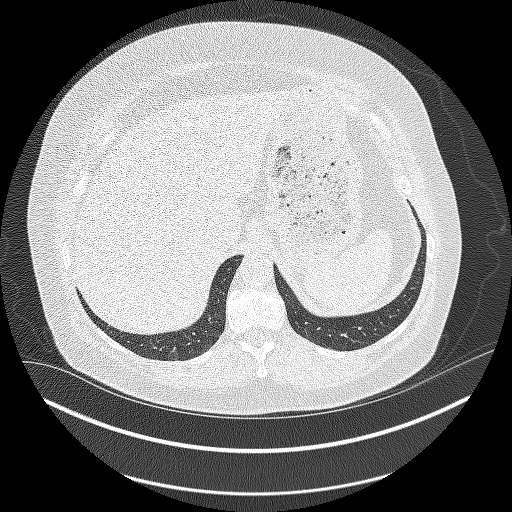
[im 98/342  lung]
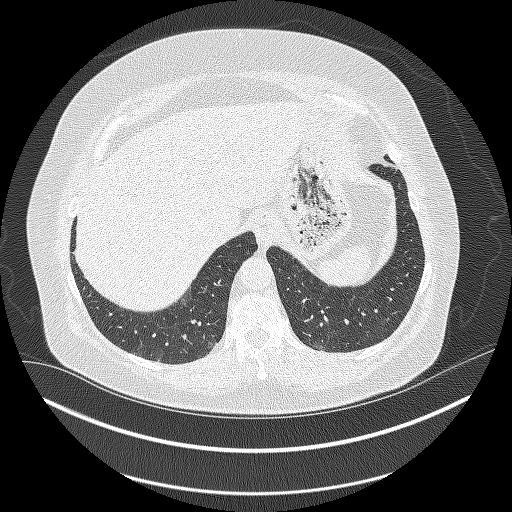
[im 130/342  mediastinal]
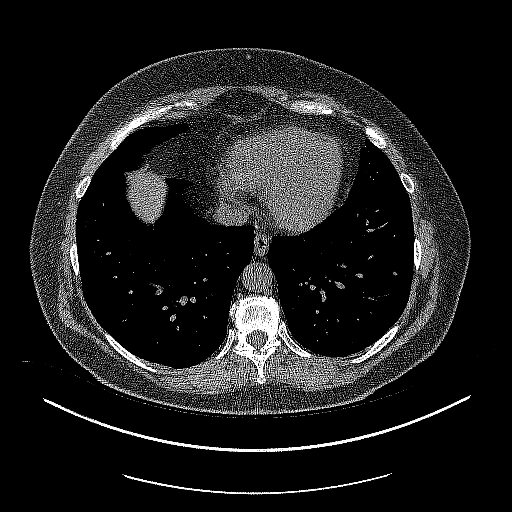
[im 130/342  lung]
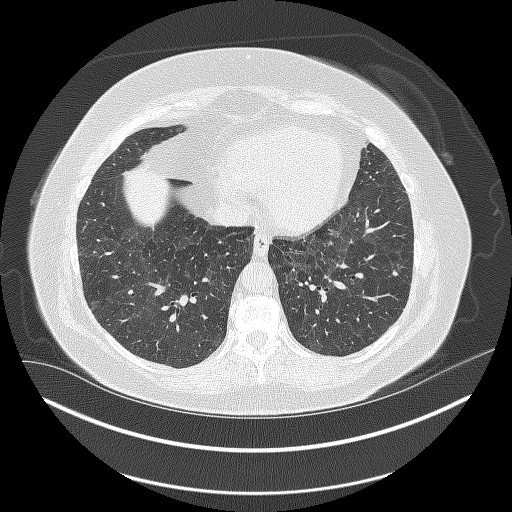
[im 163/342  lung]
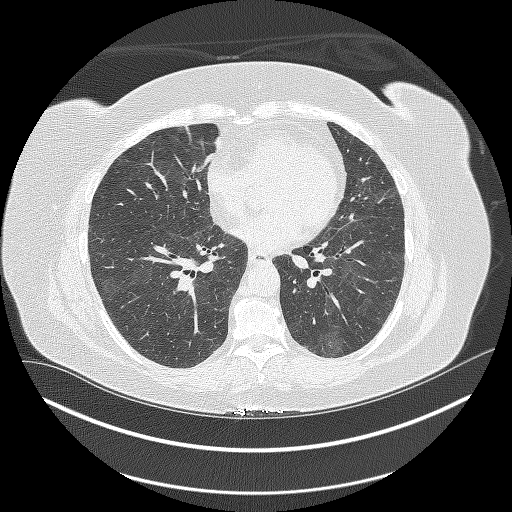
[im 179/342  lung]
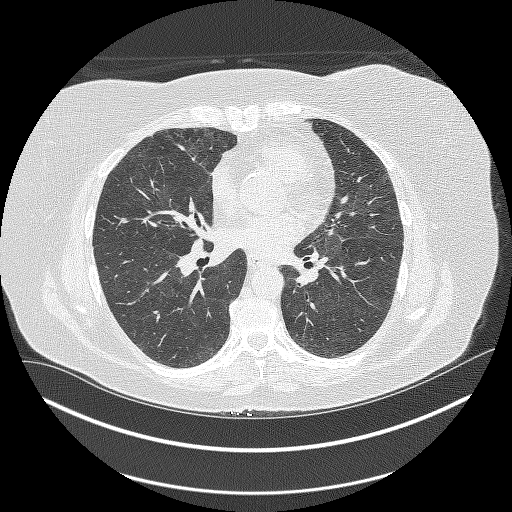
[im 212/342  lung]
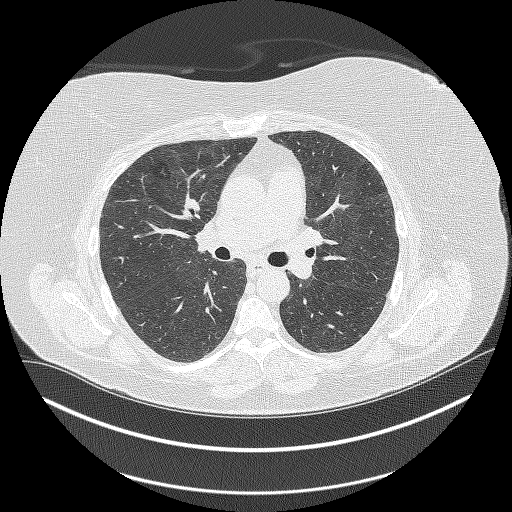
[im 244/342  mediastinal]
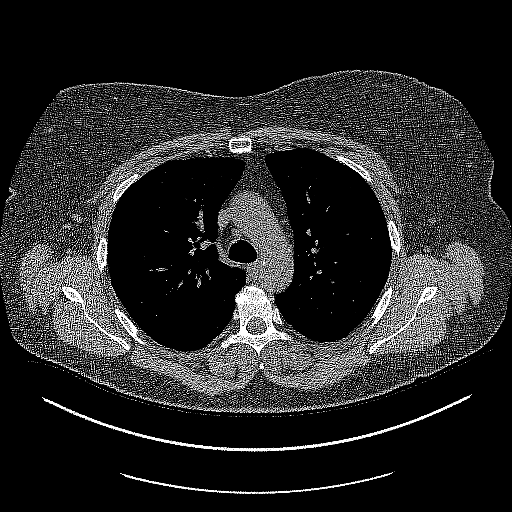
[im 244/342  lung]
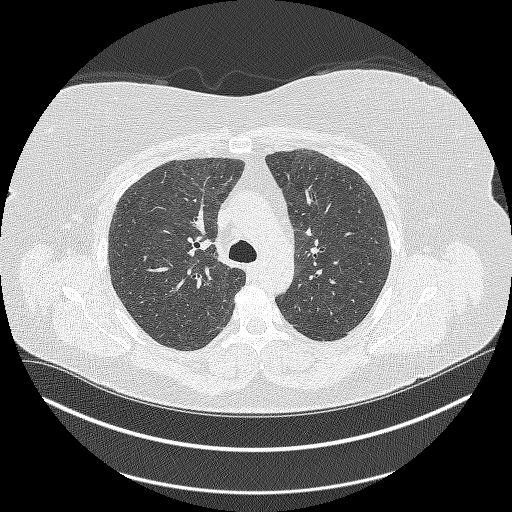
[im 260/342  lung]
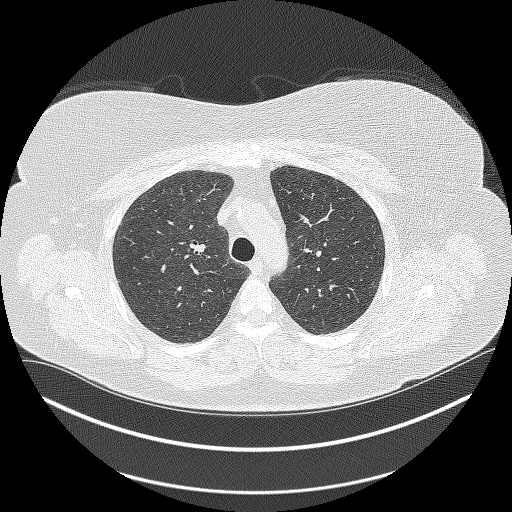
[im 293/342  lung]
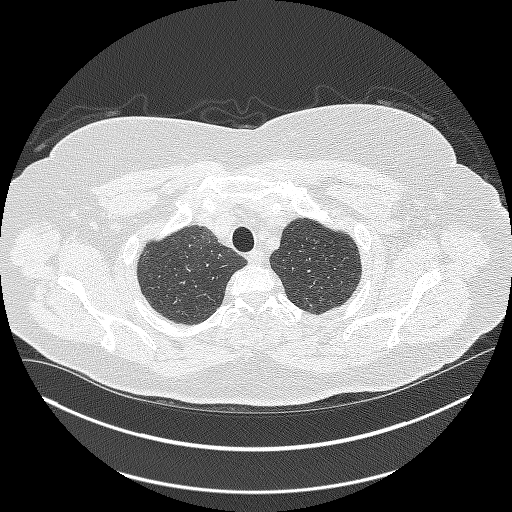
[im 325/342  lung]
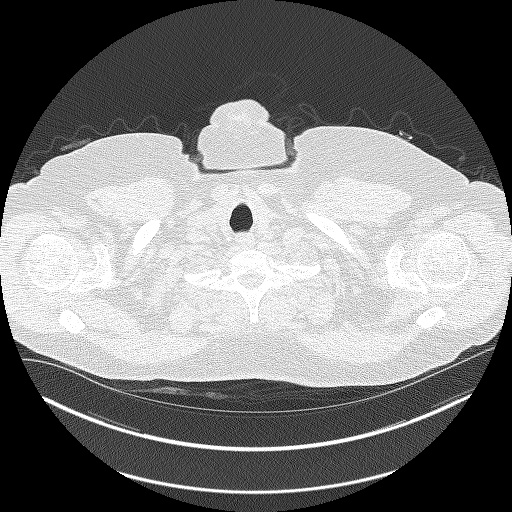

[Series 4: coronals lung 1.00 cor · coronal · 0.67mm/px · 3 of 365 slices shown]
[im 73/365  lung]
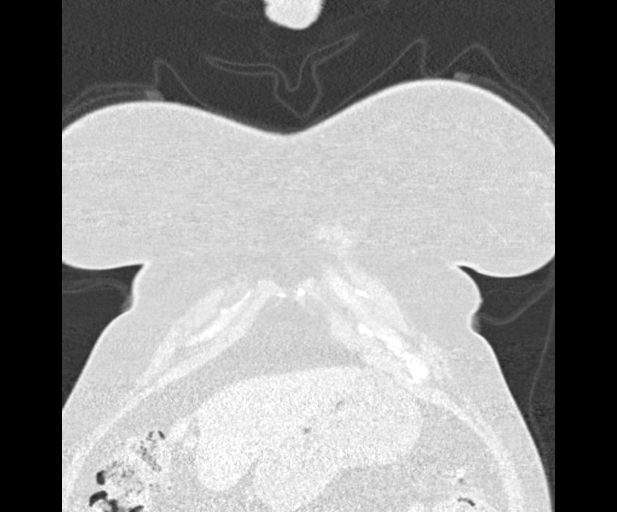
[im 146/365  lung]
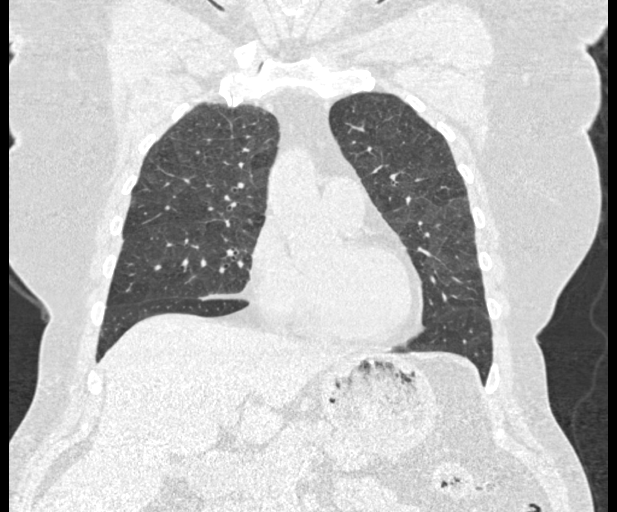
[im 219/365  lung]
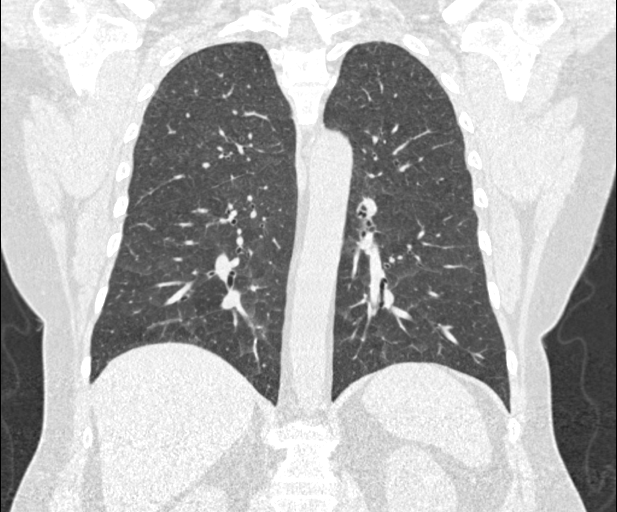

[15 of 40 positions shown; findings below may reference images not displayed]

FINDINGS: Cardiovascular: Normal heart size. No significant pericardial
effusion/thickening. Left anterior descending and right coronary
atherosclerosis. Atherosclerotic nonaneurysmal thoracic aorta.
Normal caliber pulmonary arteries.

Mediastinum/Nodes: No discrete thyroid nodules. Unremarkable
esophagus. No pathologically enlarged axillary, mediastinal or hilar
lymph nodes, noting limited sensitivity for the detection of hilar
adenopathy on this noncontrast study.

Lungs/Pleura: No pneumothorax. No pleural effusion. Moderate
centrilobular and paraseptal emphysema with mild diffuse bronchial
wall thickening. No acute consolidative airspace disease or lung
masses. A few scattered small solid pulmonary nodules in both lungs,
largest 4.1 mm in volume derived mean diameter in the peripheral
left lower lobe (series 3/image 213).

Upper abdomen: No acute abnormality.

Musculoskeletal: No aggressive appearing focal osseous lesions.
Moderate thoracic spondylosis.
IMPRESSION: 1. Lung-RADS 2, benign appearance or behavior. Continue annual
screening with low-dose chest CT without contrast in 12 months.
2. Two-vessel coronary atherosclerosis.
3. Aortic Atherosclerosis (OFWW2-RCZ.Z) and Emphysema (OFWW2-WVX.I).

## 2022-04-22 ENCOUNTER — Encounter: Payer: Self-pay | Admitting: Oncology

## 2022-04-22 ENCOUNTER — Inpatient Hospital Stay: Payer: Medicare Other

## 2022-04-22 ENCOUNTER — Inpatient Hospital Stay: Payer: Medicare Other | Attending: Oncology | Admitting: Oncology

## 2022-04-22 VITALS — BP 162/86 | HR 71 | Temp 96.3°F | Resp 18 | Wt 200.9 lb

## 2022-04-22 DIAGNOSIS — F17210 Nicotine dependence, cigarettes, uncomplicated: Secondary | ICD-10-CM | POA: Insufficient documentation

## 2022-04-22 DIAGNOSIS — Z809 Family history of malignant neoplasm, unspecified: Secondary | ICD-10-CM | POA: Diagnosis not present

## 2022-04-22 DIAGNOSIS — Z803 Family history of malignant neoplasm of breast: Secondary | ICD-10-CM | POA: Insufficient documentation

## 2022-04-22 DIAGNOSIS — Z808 Family history of malignant neoplasm of other organs or systems: Secondary | ICD-10-CM | POA: Insufficient documentation

## 2022-04-22 DIAGNOSIS — R233 Spontaneous ecchymoses: Secondary | ICD-10-CM | POA: Insufficient documentation

## 2022-04-22 DIAGNOSIS — Z79899 Other long term (current) drug therapy: Secondary | ICD-10-CM | POA: Insufficient documentation

## 2022-04-22 DIAGNOSIS — D72828 Other elevated white blood cell count: Secondary | ICD-10-CM

## 2022-04-22 DIAGNOSIS — Z801 Family history of malignant neoplasm of trachea, bronchus and lung: Secondary | ICD-10-CM | POA: Diagnosis not present

## 2022-04-22 DIAGNOSIS — D72829 Elevated white blood cell count, unspecified: Secondary | ICD-10-CM | POA: Diagnosis present

## 2022-04-22 LAB — CBC WITH DIFFERENTIAL/PLATELET
Abs Immature Granulocytes: 0.04 10*3/uL (ref 0.00–0.07)
Basophils Absolute: 0.1 10*3/uL (ref 0.0–0.1)
Basophils Relative: 1 %
Eosinophils Absolute: 0.2 10*3/uL (ref 0.0–0.5)
Eosinophils Relative: 1 %
HCT: 47.3 % — ABNORMAL HIGH (ref 36.0–46.0)
Hemoglobin: 16.1 g/dL — ABNORMAL HIGH (ref 12.0–15.0)
Immature Granulocytes: 0 %
Lymphocytes Relative: 32 %
Lymphs Abs: 3.7 10*3/uL (ref 0.7–4.0)
MCH: 31.3 pg (ref 26.0–34.0)
MCHC: 34 g/dL (ref 30.0–36.0)
MCV: 92 fL (ref 80.0–100.0)
Monocytes Absolute: 0.6 10*3/uL (ref 0.1–1.0)
Monocytes Relative: 5 %
Neutro Abs: 7.1 10*3/uL (ref 1.7–7.7)
Neutrophils Relative %: 61 %
Platelets: 297 10*3/uL (ref 150–400)
RBC: 5.14 MIL/uL — ABNORMAL HIGH (ref 3.87–5.11)
RDW: 12.9 % (ref 11.5–15.5)
WBC: 11.7 10*3/uL — ABNORMAL HIGH (ref 4.0–10.5)
nRBC: 0 % (ref 0.0–0.2)

## 2022-04-22 LAB — COMPREHENSIVE METABOLIC PANEL
ALT: 25 U/L (ref 0–44)
AST: 21 U/L (ref 15–41)
Albumin: 4.1 g/dL (ref 3.5–5.0)
Alkaline Phosphatase: 98 U/L (ref 38–126)
Anion gap: 7 (ref 5–15)
BUN: 8 mg/dL (ref 8–23)
CO2: 24 mmol/L (ref 22–32)
Calcium: 8.7 mg/dL — ABNORMAL LOW (ref 8.9–10.3)
Chloride: 107 mmol/L (ref 98–111)
Creatinine, Ser: 0.83 mg/dL (ref 0.44–1.00)
GFR, Estimated: >60 mL/min (ref >60)
Glucose, Bld: 117 mg/dL — ABNORMAL HIGH (ref 70–99)
Potassium: 4.1 mmol/L (ref 3.5–5.1)
Sodium: 138 mmol/L (ref 135–145)
Total Bilirubin: 0.8 mg/dL (ref 0.3–1.2)
Total Protein: 7.6 g/dL (ref 6.5–8.1)

## 2022-04-22 LAB — PROTIME-INR
INR: 1.1 (ref 0.8–1.2)
Prothrombin Time: 13.8 seconds (ref 11.4–15.2)

## 2022-04-22 LAB — APTT: aPTT: 28 seconds (ref 24–36)

## 2022-04-22 LAB — HEPATITIS PANEL, ACUTE
HCV Ab: NONREACTIVE
Hep A IgM: NONREACTIVE
Hep B C IgM: NONREACTIVE
Hepatitis B Surface Ag: NONREACTIVE

## 2022-04-22 LAB — TECHNOLOGIST SMEAR REVIEW: Plt Morphology: ADEQUATE

## 2022-04-22 LAB — HIV ANTIBODY (ROUTINE TESTING W REFLEX): HIV Screen 4th Generation wRfx: NONREACTIVE

## 2022-04-22 LAB — LACTATE DEHYDROGENASE: LDH: 149 U/L (ref 98–192)

## 2022-04-22 NOTE — Progress Notes (Signed)
Hematology/Oncology Consult note [phone] [phone]         Patient Care Team: [name], MD as PCP - General (Family Medicine)  REFERRING PROVIDER: [name], MD   CHIEF COMPLAINTS/REASON FOR VISIT:  Evaluation of easy bruising and leukocytosis.  HISTORY OF PRESENTING ILLNESS:   Margaret Delgado is a  61 y.o.  female with PMH listed below was seen in consultation at the request of  [name], MD  for evaluation of easy bruising and leukocytosis.  Patient has noticed easy bruising on bilateral hands.  Without known injury/trauma.  Bruising is usually on her bilateral upper extremities.  No other places Patient has a chronic leukocytosis for years.  Denies any unintentional weight loss, night sweats, fever, unhealing wound. Patient is current everyday smoker. Denies any steroid use.  MEDICAL HISTORY:  Past Medical History:  Diagnosis Date   COPD (chronic obstructive pulmonary disease) ([place])    Hypercholesterolemia    Major depressive disorder, recurrent episode, moderate (HCC)    Panic disorder without agoraphobia     SURGICAL HISTORY: Past Surgical History:  Procedure Laterality Date   CESAREAN SECTION     COLONOSCOPY WITH PROPOFOL N/A 08/28/2020   Procedure: COLONOSCOPY WITH PROPOFOL;  Surgeon: Jonathon Bellows, MD;  Location: [facility] ENDOSCOPY;  Service: Gastroenterology;  Laterality: N/A;    SOCIAL HISTORY: Social History   Socioeconomic History   Marital status: Single    Spouse name: Not on file   Number of children: Not on file   Years of education: Not on file   Highest education level: Not on file  Occupational History   Not on file  Tobacco Use   Smoking status: Every Day    Packs/day: 1.25    Years: 43.00    Total pack years: 53.75    Types: Cigarettes   Smokeless tobacco: Never  Vaping Use   Vaping Use: Never used  Substance and Sexual Activity   Alcohol use: Not Currently    Comment: occasionl   Drug use: No    Sexual activity: Not Currently  Other Topics Concern   Not on file  Social History Narrative   Not on file   Social Determinants of Health   Financial Resource Strain: Not on file  Food Insecurity: Not on file  Transportation Needs: Not on file  Physical Activity: Not on file  Stress: Not on file  Social Connections: Not on file  Intimate Partner Violence: Not on file    FAMILY HISTORY: Family History  Problem Relation Age of Onset   Parkinson's disease Mother    Depression Mother    Anxiety disorder Mother    Cancer Father    Lung cancer Father    Brain cancer Father    Stroke Sister    Anxiety disorder Sister    Depression Sister    Breast cancer Paternal Grandmother 81    ALLERGIES:  has No Known Allergies.  MEDICATIONS:  Current Outpatient Medications  Medication Sig Dispense Refill   albuterol (VENTOLIN HFA) 108 (90 Base) MCG/ACT inhaler Inhale into the lungs.     atorvastatin (LIPITOR) 40 MG tablet Take 1 tablet by mouth daily.     budesonide-formoterol (SYMBICORT) 160-4.5 MCG/ACT inhaler Inhale 2 puffs into the lungs 2 (two) times daily.     buPROPion (WELLBUTRIN XL) 300 MG 24 hr tablet Take 300 mg by mouth at bedtime.     LORazepam (ATIVAN) 1 MG tablet Take 1 mg by mouth 3 (three) times  daily.     Multiple Vitamin tablet Take 1 tablet by mouth daily.     sertraline (ZOLOFT) 100 MG tablet Take 1 tablet (100 mg total) by mouth daily after breakfast. 30 tablet 1   No current facility-administered medications for this visit.    Review of Systems  Constitutional:  Negative for appetite change, chills, fatigue and fever.  HENT:   Negative for hearing loss and voice change.   Eyes:  Negative for eye problems.  Respiratory:  Negative for chest tightness and cough.   Cardiovascular:  Negative for chest pain.  Gastrointestinal:  Negative for abdominal distention, abdominal pain and blood in stool.  Endocrine: Negative for hot flashes.  Genitourinary:  Negative  for difficulty urinating and frequency.   Musculoskeletal:  Negative for arthralgias.  Skin:  Negative for itching and rash.  Neurological:  Negative for extremity weakness.  Hematological:  Negative for adenopathy. Bruises/bleeds easily.  Psychiatric/Behavioral:  Negative for confusion.    PHYSICAL EXAMINATION: ECOG PERFORMANCE STATUS: 0 - Asymptomatic Vitals:   04/22/22 0852  BP: (!) 162/86  Pulse: 71  Resp: 18  Temp: (!) 96.3 F (35.7 C)   Filed Weights   04/22/22 0852  Weight: 200 lb 14.4 oz (91.1 kg)    Physical Exam Constitutional:      General: She is not in acute distress. HENT:     Head: Normocephalic and atraumatic.  Eyes:     General: No scleral icterus. Cardiovascular:     Rate and Rhythm: Normal rate and regular rhythm.     Heart sounds: Normal heart sounds.  Pulmonary:     Effort: Pulmonary effort is normal. No respiratory distress.     Breath sounds: No wheezing.  Abdominal:     General: Bowel sounds are normal. There is no distension.     Palpations: Abdomen is soft.  Musculoskeletal:        General: No deformity. Normal range of motion.     Cervical back: Normal range of motion and neck supple.  Skin:    General: Skin is warm and dry.     Comments: Ecchymosis on right hand  Neurological:     Mental Status: She is alert and oriented to person, place, and time. Mental status is at baseline.     Cranial Nerves: No cranial nerve deficit.     Coordination: Coordination normal.  Psychiatric:        Mood and Affect: Mood normal.     LABORATORY DATA:  I have reviewed the data as listed    Latest Ref Rng & Units 04/22/2022    9:43 AM 07/22/2014    3:20 AM 07/20/2014    5:12 AM  CBC  WBC 4.0 - 10.5 K/uL 11.7  24.2  24.9   Hemoglobin 12.0 - 15.0 g/dL 16.1   13.4   Hematocrit 36.0 - 46.0 % 47.3   40.7   Platelets 150 - 400 K/uL 297   357       Latest Ref Rng & Units 04/22/2022    9:43 AM 03/11/2021    9:08 AM 07/22/2014    3:20 AM  CMP   Glucose 70 - 99 mg/dL 117   207   BUN 8 - 23 mg/dL 8   18   Creatinine 0.44 - 1.00 mg/dL 0.83  0.90  0.69   Sodium 135 - 145 mmol/L 138   139   Potassium 3.5 - 5.1 mmol/L 4.1   4.4   Chloride 98 - 111 mmol/L  107   105   CO2 22 - 32 mmol/L 24   26   Calcium 8.9 - 10.3 mg/dL 8.7   8.6   Total Protein 6.5 - 8.1 g/dL 7.6     Total Bilirubin 0.3 - 1.2 mg/dL 0.8     Alkaline Phos 38 - 126 U/L 98     AST 15 - 41 U/L 21     ALT 0 - 44 U/L 25         RADIOGRAPHIC STUDIES: I have personally reviewed the radiological images as listed and agreed with the findings in the report. No results found.     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  No need for intervention. Platelet count is normal.  Coags are normal.  Continue observation.  Leucocytosis Leukocytosis likely secondary to smoking. Check CBC, CMP, flow cytometry, SPEP, LDH.  If work-up is negative I recommend observation.  We discussed that if above work-up is negative, patient opted to continue follow-up with primary care provider for observation.  She may reestablish care in the future if clinically indicated. Orders Placed This Encounter  Procedures   CBC with Differential/Platelet    Standing Status:   Future    Number of Occurrences:   1    Standing Expiration Date:   04/23/2023   Comprehensive metabolic panel    Standing Status:   Future    Number of Occurrences:   1    Standing Expiration Date:   04/23/2023   HIV Antibody (routine testing w rflx)    Standing Status:   Future    Number of Occurrences:   1    Standing Expiration Date:   04/23/2023   Hepatitis panel, acute    Standing Status:   Future    Number of Occurrences:   1    Standing Expiration Date:   04/23/2023   Lactate dehydrogenase    Standing Status:   Future    Number of Occurrences:   1    Standing Expiration Date:   04/23/2023    Flow cytometry panel-leukemia/lymphoma work-up    Standing Status:   Future    Number of Occurrences:   1    Standing Expiration Date:   04/23/2023   Protein Electro, Random Urine    Standing Status:   Future    Number of Occurrences:   1    Standing Expiration Date:   04/23/2023   Technologist smear review    Standing Status:   Future    Number of Occurrences:   1    Standing Expiration Date:   04/23/2023    Order Specific Question:   Clinical information:    Answer:   leukocytosis   APTT    Standing Status:   Future    Number of Occurrences:   1    Standing Expiration Date:   04/23/2023   Protime-INR    Standing Status:   Future    Number of Occurrences:   1    Standing Expiration Date:   04/23/2023   Miscellaneous [facility] test (send-out)    Standing Status:   Future    Standing Expiration Date:   04/23/2023    Order Specific Question:   Test name / description:    Answer:   [facility] [id number] vitmain c   Vitamin C    All questions were answered. The patient  knows to call the clinic with any problems, questions or concerns.  [name], MD   Return of visit:  Thank you for this kind referral and the opportunity to participate in the care of this patient. A copy of today's note is routed to referring provider   Earlie Server, MD, PhD [facility] Hematology Oncology 04/22/2022

## 2022-04-22 NOTE — Assessment & Plan Note (Signed)
Leukocytosis likely secondary to smoking. Check CBC, CMP, flow cytometry, SPEP, LDH.  If work-up is negative I recommend observation.

## 2022-04-22 NOTE — Assessment & Plan Note (Addendum)
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  No need for intervention. Platelet count is normal.  Coags are normal.  Continue observation.

## 2022-04-22 NOTE — Progress Notes (Signed)
Pt here to establish care. Pt reports that WBC count is elevated and she bruises easily and has spontaneous rashes.

## 2022-04-23 LAB — PROTEIN ELECTRO, RANDOM URINE
Albumin ELP, Urine: 0 %
Alpha-1-Globulin, U: 0 %
Alpha-2-Globulin, U: 0 %
Beta Globulin, U: 0 %
Gamma Globulin, U: 0 %
Total Protein, Urine: <4 mg/dL

## 2022-04-25 LAB — COMP PANEL: LEUKEMIA/LYMPHOMA

## 2022-04-25 LAB — VITAMIN C: Vitamin C: 0.5 mg/dL (ref 0.4–2.0)

## 2022-04-26 ENCOUNTER — Encounter: Payer: Self-pay | Admitting: Oncology

## 2022-06-06 ENCOUNTER — Inpatient Hospital Stay: Payer: Medicare Other

## 2022-06-10 ENCOUNTER — Inpatient Hospital Stay: Payer: Medicare Other

## 2022-06-12 ENCOUNTER — Telehealth: Payer: Self-pay | Admitting: Oncology

## 2022-06-12 NOTE — Telephone Encounter (Signed)
Margaret Delgado called and wanted to cancel her lab appt on 11/21. She had labs drawn at Dr Lennox Grumbles office today 06/12/22 and they will send results to [place].Appointment is cancelled with an explanation.

## 2022-06-12 NOTE — Telephone Encounter (Signed)
Thanks.   Will wait on CBC results from Dr. Lennox Grumbles office.

## 2022-06-17 ENCOUNTER — Other Ambulatory Visit: Payer: Medicare Other

## 2022-06-27 ENCOUNTER — Encounter: Payer: Self-pay | Admitting: Oncology

## 2022-07-06 ENCOUNTER — Other Ambulatory Visit: Payer: Self-pay | Admitting: Oncology

## 2022-07-06 DIAGNOSIS — D751 Secondary polycythemia: Secondary | ICD-10-CM | POA: Insufficient documentation

## 2022-07-07 ENCOUNTER — Telehealth: Payer: Self-pay

## 2022-07-07 NOTE — Telephone Encounter (Signed)
Pt informed and verbalized understanding. She said she will call back or send mychart message to let us know when she will be able to come back.   When she contacts [place] back, she will need:   Labs  MD 2 weeks after labs.

## 2022-07-07 NOTE — Telephone Encounter (Signed)
-----   Message from Earlie Server, MD sent at 07/06/2022  9:22 PM EST ----- Please let her know that I have reviewed her blood work.  Her hemoglobin level is elevated. I recommend additional work up I have ordered labs.  Please arrange her to follow up 2 weeks after the blood work.

## 2022-07-10 NOTE — Telephone Encounter (Signed)
Jenn, will you contact pt to follow up on scheduling appts?  Labs,  MD 2 weeks after labs

## 2022-07-14 ENCOUNTER — Ambulatory Visit: Admission: RE | Admit: 2022-07-14 | Payer: Medicare Other | Source: Ambulatory Visit

## 2023-06-11 ENCOUNTER — Other Ambulatory Visit: Payer: Self-pay | Admitting: Family Medicine

## 2023-06-11 DIAGNOSIS — Z1231 Encounter for screening mammogram for malignant neoplasm of breast: Secondary | ICD-10-CM

## 2023-06-12 ENCOUNTER — Other Ambulatory Visit: Payer: Self-pay | Admitting: Family Medicine

## 2023-06-12 DIAGNOSIS — Z1231 Encounter for screening mammogram for malignant neoplasm of breast: Secondary | ICD-10-CM

## 2023-06-23 ENCOUNTER — Encounter: Payer: Self-pay | Admitting: *Deleted

## 2023-06-23 ENCOUNTER — Telehealth: Payer: Self-pay | Admitting: *Deleted

## 2023-06-23 NOTE — Telephone Encounter (Signed)
Attempted to contact pt regarding scheduling annual lung cancer screening. Voicemail not set up.  Letter mailed to pt asking her to contact us to schedule.

## 2023-06-29 ENCOUNTER — Other Ambulatory Visit: Payer: Self-pay

## 2023-06-29 DIAGNOSIS — F17210 Nicotine dependence, cigarettes, uncomplicated: Secondary | ICD-10-CM

## 2023-06-29 DIAGNOSIS — Z87891 Personal history of nicotine dependence: Secondary | ICD-10-CM

## 2023-06-29 DIAGNOSIS — Z122 Encounter for screening for malignant neoplasm of respiratory organs: Secondary | ICD-10-CM

## 2023-06-30 ENCOUNTER — Ambulatory Visit
Admission: RE | Admit: 2023-06-30 | Discharge: 2023-06-30 | Disposition: A | Discharge status: Home / Self Care | Payer: 59 | Source: Ambulatory Visit | Attending: *Deleted | Admitting: *Deleted

## 2023-06-30 DIAGNOSIS — J439 Emphysema, unspecified: Secondary | ICD-10-CM | POA: Insufficient documentation

## 2023-06-30 DIAGNOSIS — Z122 Encounter for screening for malignant neoplasm of respiratory organs: Secondary | ICD-10-CM

## 2023-06-30 DIAGNOSIS — I700 Atherosclerosis of aorta: Secondary | ICD-10-CM | POA: Diagnosis not present

## 2023-06-30 DIAGNOSIS — F17210 Nicotine dependence, cigarettes, uncomplicated: Secondary | ICD-10-CM | POA: Diagnosis present

## 2023-06-30 DIAGNOSIS — Z87891 Personal history of nicotine dependence: Secondary | ICD-10-CM

## 2023-07-03 ENCOUNTER — Ambulatory Visit
Admission: RE | Admit: 2023-07-03 | Discharge: 2023-07-03 | Disposition: A | Discharge status: Home / Self Care | Payer: 59 | Source: Ambulatory Visit | Attending: Family Medicine | Admitting: Family Medicine

## 2023-07-03 DIAGNOSIS — Z1231 Encounter for screening mammogram for malignant neoplasm of breast: Secondary | ICD-10-CM | POA: Diagnosis present

## 2023-07-15 ENCOUNTER — Other Ambulatory Visit: Payer: Self-pay

## 2023-07-15 DIAGNOSIS — F17210 Nicotine dependence, cigarettes, uncomplicated: Secondary | ICD-10-CM

## 2023-07-15 DIAGNOSIS — Z87891 Personal history of nicotine dependence: Secondary | ICD-10-CM

## 2023-07-15 DIAGNOSIS — Z122 Encounter for screening for malignant neoplasm of respiratory organs: Secondary | ICD-10-CM

## 2024-04-04 ENCOUNTER — Encounter: Payer: Self-pay | Admitting: Oncology

## 2024-04-04 ENCOUNTER — Ambulatory Visit (HOSPITAL_BASED_OUTPATIENT_CLINIC_OR_DEPARTMENT_OTHER): Admitting: Oncology

## 2024-04-04 ENCOUNTER — Inpatient Hospital Stay: Attending: Oncology

## 2024-04-04 VITALS — BP 148/81 | HR 73 | Temp 97.1°F | Resp 18 | Wt 185.6 lb

## 2024-04-04 DIAGNOSIS — Z720 Tobacco use: Secondary | ICD-10-CM | POA: Insufficient documentation

## 2024-04-04 DIAGNOSIS — D72829 Elevated white blood cell count, unspecified: Secondary | ICD-10-CM | POA: Diagnosis present

## 2024-04-04 DIAGNOSIS — Z801 Family history of malignant neoplasm of trachea, bronchus and lung: Secondary | ICD-10-CM | POA: Diagnosis not present

## 2024-04-04 DIAGNOSIS — D751 Secondary polycythemia: Secondary | ICD-10-CM | POA: Diagnosis not present

## 2024-04-04 DIAGNOSIS — Z79899 Other long term (current) drug therapy: Secondary | ICD-10-CM | POA: Diagnosis not present

## 2024-04-04 DIAGNOSIS — D72828 Other elevated white blood cell count: Secondary | ICD-10-CM

## 2024-04-04 DIAGNOSIS — Z803 Family history of malignant neoplasm of breast: Secondary | ICD-10-CM | POA: Insufficient documentation

## 2024-04-04 DIAGNOSIS — Z808 Family history of malignant neoplasm of other organs or systems: Secondary | ICD-10-CM | POA: Diagnosis not present

## 2024-04-04 DIAGNOSIS — Z809 Family history of malignant neoplasm, unspecified: Secondary | ICD-10-CM | POA: Insufficient documentation

## 2024-04-04 DIAGNOSIS — F17210 Nicotine dependence, cigarettes, uncomplicated: Secondary | ICD-10-CM | POA: Diagnosis not present

## 2024-04-04 LAB — CBC WITH DIFFERENTIAL/PLATELET
Abs Immature Granulocytes: 0.04 K/uL (ref 0.00–0.07)
Basophils Absolute: 0.1 K/uL (ref 0.0–0.1)
Basophils Relative: 1 %
Eosinophils Absolute: 0.2 K/uL (ref 0.0–0.5)
Eosinophils Relative: 2 %
HCT: 47.5 % — ABNORMAL HIGH (ref 36.0–46.0)
Hemoglobin: 15.9 g/dL — ABNORMAL HIGH (ref 12.0–15.0)
Immature Granulocytes: 0 %
Lymphocytes Relative: 35 %
Lymphs Abs: 3.9 K/uL (ref 0.7–4.0)
MCH: 31.5 pg (ref 26.0–34.0)
MCHC: 33.5 g/dL (ref 30.0–36.0)
MCV: 94.1 fL (ref 80.0–100.0)
Monocytes Absolute: 0.7 K/uL (ref 0.1–1.0)
Monocytes Relative: 6 %
Neutro Abs: 6.3 K/uL (ref 1.7–7.7)
Neutrophils Relative %: 56 %
Platelets: 349 K/uL (ref 150–400)
RBC: 5.05 MIL/uL (ref 3.87–5.11)
RDW: 11.8 % (ref 11.5–15.5)
WBC: 11.2 K/uL — ABNORMAL HIGH (ref 4.0–10.5)
nRBC: 0 % (ref 0.0–0.2)

## 2024-04-04 NOTE — Assessment & Plan Note (Signed)
 Recommend smoke cessation.  She follows up with lung cancer screening program.

## 2024-04-04 NOTE — Progress Notes (Addendum)
 Hematology/Oncology Progress note [phone] [phone]            Patient Care Team: Buren Rock HERO, MD as PCP - General (Family Medicine)  REFERRING PROVIDER: Buren Rock HERO, MD   CHIEF COMPLAINTS/REASON FOR VISIT:  Erythrocytosis leukocytosis.   ASSESSMENT & PLAN:   Leucocytosis Previous workup is negative.  Likely secondary to smoking  Erythrocytosis Labs are reviewed and discussed with patient.  Erythrocytosis is an abnormal elevation of hemoglobin and/or hematocrit in peripheral blood, and this can be caused by primary etiology, ie myeloproliferative disease, or secondary etiology, ie sleep apnea, smoking, testosterone etc  or familiar condition.   Likely secondary to smoking.  Rule out other etiologies. I will check erythropoietin , carbo monoxide level, JAK2 with reflex to other mutations, BCR-ABL1 FISH.      Addendum: JAK2 V617F mutation negative, with reflex to other mutations CALR, MPL, JAK 2 Ex 12-15 mutations negative. BCR ABL1 FISH negative. Normal Epo, high cabo monoxide. 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 No need for phlebotomy for now.    Tobacco use Recommend smoke cessation.  She follows up with lung cancer screening program.  Orders Placed This Encounter  Procedures   CBC with Differential/Platelet    Standing Status:   Future    Number of Occurrences:   1    Expected Date:   04/04/2024    Expiration Date:   07/03/2024   BCR-ABL1 FISH    Standing Status:   Future    Number of Occurrences:   1    Expected Date:   04/04/2024    Expiration Date:   07/03/2024   Carbon monoxide, blood (performed at ref lab)    Standing Status:   Future    Number of Occurrences:   1    Expected Date:   04/04/2024    Expiration Date:   07/03/2024   Erythropoietin     Standing Status:   Future    Number of Occurrences:   1    Expected Date:   04/04/2024    Expiration Date:   07/03/2024   JAK2 V617F rfx CALR/MPL/E12-15    Standing Status:   Future     Number of Occurrences:   1    Expected Date:   04/04/2024    Expiration Date:   07/03/2024   Flow cytometry panel-leukemia/lymphoma work-up    Standing Status:   Future    Number of Occurrences:   1    Expected Date:   04/04/2024    Expiration Date:   07/03/2024   Follow-up in 6 months. All questions were answered. The patient knows to call the clinic with any problems, questions or concerns.  Zelphia Cap, MD, PhD [facility] Hematology Oncology 04/04/2024   HISTORY OF PRESENTING ILLNESS:   Margaret Delgado is a  63 y.o.  female with PMH listed below was seen in consultation at the request of  Buren Rock HERO, MD  for evaluation of easy bruising and leukocytosis.  Patient has noticed easy bruising on bilateral hands.  Without known injury/trauma.  Bruising is usually on her bilateral upper extremities.  No other places Patient has a chronic leukocytosis for years.  Denies any unintentional weight loss, night sweats, fever, unhealing wound. Patient is current everyday smoker. Denies any steroid use.   INTERVAL HISTORY Margaret Delgado is a 63 y.o. female who has above history reviewed by me today presents for follow up visit for leukocytosis and erythrocytosis.  Patient presented to reestablish care.  Patient smokes at least a pack of cigarettes per day.  MEDICAL HISTORY:  Past Medical History:  Diagnosis Date   COPD (chronic obstructive pulmonary disease) (HCC)    Hypercholesterolemia    Major depressive disorder, recurrent episode, moderate (HCC)    Panic disorder without agoraphobia     SURGICAL HISTORY: Past Surgical History:  Procedure Laterality Date   CESAREAN SECTION     COLONOSCOPY WITH PROPOFOL  N/A 08/28/2020   Procedure: COLONOSCOPY WITH PROPOFOL ;  Surgeon: Therisa Bi, MD;  Location: [facility] ENDOSCOPY;  Service: Gastroenterology;  Laterality: N/A;    SOCIAL HISTORY: Social History   Socioeconomic History   Marital status: Single    Spouse name: Not on file   Number of  children: Not on file   Years of education: Not on file   Highest education level: Not on file  Occupational History   Not on file  Tobacco Use   Smoking status: Every Day    Current packs/day: 1.25    Average packs/day: 1.3 packs/day for 43.0 years (53.8 ttl pk-yrs)    Types: Cigarettes   Smokeless tobacco: Never  Vaping Use   Vaping status: Never Used  Substance and Sexual Activity   Alcohol use: Not Currently    Comment: occasionl   Drug use: No   Sexual activity: Not Currently  Other Topics Concern   Not on file  Social History Narrative   Not on file   Social Drivers of Health   Financial Resource Strain: Not on file  Food Insecurity: Not on file  Transportation Needs: Not on file  Physical Activity: Not on file  Stress: Not on file  Social Connections: Not on file  Intimate Partner Violence: Not on file    FAMILY HISTORY: Family History  Problem Relation Age of Onset   Parkinson's disease Mother    Depression Mother    Anxiety disorder Mother    Cancer Father    Lung cancer Father    Brain cancer Father    Stroke Sister    Anxiety disorder Sister    Depression Sister    Breast cancer Paternal Grandmother 57    ALLERGIES:  has no known allergies.  MEDICATIONS:  Current Outpatient Medications  Medication Sig Dispense Refill   albuterol (VENTOLIN HFA) 108 (90 Base) MCG/ACT inhaler Inhale into the lungs.     atorvastatin (LIPITOR) 40 MG tablet Take 1 tablet by mouth daily.     budesonide-formoterol (SYMBICORT) 160-4.5 MCG/ACT inhaler Inhale 2 puffs into the lungs 2 (two) times daily.     isosorbide mononitrate (IMDUR) 30 MG 24 hr tablet Take 30 mg by mouth.     LORazepam (ATIVAN) 1 MG tablet Take 1 mg by mouth 3 (three) times daily.     Multiple Vitamin tablet Take 1 tablet by mouth daily.     sertraline  (ZOLOFT ) 100 MG tablet Take 1 tablet (100 mg total) by mouth daily after breakfast. (Patient taking differently: Take 50 mg by mouth daily after  breakfast.) 30 tablet 1   solifenacin (VESICARE) 5 MG tablet Take 5 mg by mouth daily.     No current facility-administered medications for this visit.    Review of Systems  Constitutional:  Negative for appetite change, chills, fatigue and fever.  HENT:   Negative for hearing loss and voice change.   Eyes:  Negative for eye problems.  Respiratory:  Negative for chest tightness and cough.   Cardiovascular:  Negative for chest pain.  Gastrointestinal:  Negative for abdominal distention,  abdominal pain and blood in stool.  Endocrine: Negative for hot flashes.  Genitourinary:  Negative for difficulty urinating and frequency.   Musculoskeletal:  Negative for arthralgias.  Skin:  Negative for itching and rash.  Neurological:  Negative for extremity weakness.  Hematological:  Negative for adenopathy.  Psychiatric/Behavioral:  Negative for confusion.    PHYSICAL EXAMINATION: ECOG PERFORMANCE STATUS: 0 - Asymptomatic Vitals:   04/04/24 1443  BP: (!) 148/81  Pulse: 73  Resp: 18  Temp: (!) 97.1 F (36.2 C)   Filed Weights   04/04/24 1443  Weight: 185 lb 9.6 oz (84.2 kg)    Physical Exam Constitutional:      General: She is not in acute distress. HENT:     Head: Normocephalic and atraumatic.  Eyes:     General: No scleral icterus. Cardiovascular:     Rate and Rhythm: Normal rate and regular rhythm.     Heart sounds: Normal heart sounds.  Pulmonary:     Effort: Pulmonary effort is normal. No respiratory distress.     Breath sounds: No wheezing.  Abdominal:     General: Bowel sounds are normal. There is no distension.     Palpations: Abdomen is soft.  Musculoskeletal:        General: No deformity. Normal range of motion.     Cervical back: Normal range of motion and neck supple.  Skin:    General: Skin is warm and dry.  Neurological:     Mental Status: She is alert and oriented to person, place, and time. Mental status is at baseline.  Psychiatric:        Mood and  Affect: Mood normal.     LABORATORY DATA:  I have reviewed the data as listed    Latest Ref Rng & Units 04/04/2024    3:09 PM 04/22/2022    9:43 AM 07/22/2014    3:20 AM  CBC  WBC 4.0 - 10.5 K/uL 11.2  11.7  24.2   Hemoglobin 12.0 - 15.0 g/dL 84.0  83.8    Hematocrit 36.0 - 46.0 % 47.5  47.3    Platelets 150 - 400 K/uL 349  297        Latest Ref Rng & Units 04/22/2022    9:43 AM 03/11/2021    9:08 AM 07/22/2014    3:20 AM  CMP  Glucose 70 - 99 mg/dL 882   792   BUN 8 - 23 mg/dL 8   18   Creatinine 9.55 - 1.00 mg/dL 9.16  9.09  9.30   Sodium 135 - 145 mmol/L 138   139   Potassium 3.5 - 5.1 mmol/L 4.1   4.4   Chloride 98 - 111 mmol/L 107   105   CO2 22 - 32 mmol/L 24   26   Calcium 8.9 - 10.3 mg/dL 8.7   8.6   Total Protein 6.5 - 8.1 g/dL 7.6     Total Bilirubin 0.3 - 1.2 mg/dL 0.8     Alkaline Phos 38 - 126 U/L 98     AST 15 - 41 U/L 21     ALT 0 - 44 U/L 25         RADIOGRAPHIC STUDIES: I have personally reviewed the radiological images as listed and agreed with the findings in the report. No results found.

## 2024-04-04 NOTE — Assessment & Plan Note (Signed)
 Previous workup is negative.  Likely secondary to smoking

## 2024-04-04 NOTE — Assessment & Plan Note (Addendum)
 Labs are reviewed and discussed with patient.  Erythrocytosis is an abnormal elevation of hemoglobin and/or hematocrit in peripheral blood, and this can be caused by primary etiology, ie myeloproliferative disease, or secondary etiology, ie sleep apnea, smoking, testosterone etc  or familiar condition.   Likely secondary to smoking.  Rule out other etiologies. I will check erythropoietin , carbo monoxide level, JAK2 with reflex to other mutations, BCR-ABL1 FISH.      Addendum: JAK2 V617F mutation negative, with reflex to other mutations CALR, MPL, JAK 2 Ex 12-15 mutations negative. BCR ABL1 FISH negative. Normal Epo, high cabo monoxide. 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 No need for phlebotomy for now.

## 2024-04-05 LAB — ERYTHROPOIETIN: Erythropoietin: 11.7 m[IU]/mL (ref 2.6–18.5)

## 2024-04-06 LAB — COMP PANEL: LEUKEMIA/LYMPHOMA

## 2024-04-06 LAB — CARBON MONOXIDE, BLOOD (PERFORMED AT REF LAB): Carbon Monoxide, Blood: 11.2 % — ABNORMAL HIGH (ref 0.0–3.6)

## 2024-04-11 LAB — JAK2 V617F RFX CALR/MPL/E12-15

## 2024-04-11 LAB — CALR +MPL + E12-E15  (REFLEX)

## 2024-04-12 ENCOUNTER — Other Ambulatory Visit

## 2024-04-12 ENCOUNTER — Ambulatory Visit: Admitting: Oncology

## 2024-04-13 LAB — BCR-ABL1 FISH
Cells Analyzed: 200
Cells Counted: 200

## 2024-04-15 ENCOUNTER — Ambulatory Visit: Payer: Self-pay | Admitting: Oncology

## 2024-04-15 DIAGNOSIS — D751 Secondary polycythemia: Secondary | ICD-10-CM

## 2024-04-15 NOTE — Telephone Encounter (Signed)
-----   Message from Zelphia Cap sent at 04/15/2024 11:26 AM EDT ----- Please let pt know that her blood work up results are good. High red blood cell count is due to smoking. No need to remove blood with current blood count.  I recommend her to follow up in 1 year lab MD cbc no diff. Thanks.  Please send a copy of my note to her pcp ----- Message ----- From: Rebecka, [facility] [place] Sent: 04/04/2024   3:38 PM EDT To: Zelphia Cap, MD

## 2024-04-15 NOTE — Telephone Encounter (Signed)
 Spoke to pt and informed her of MD recommendation and follow up plan. Pt verbalized understanding. OVN faxed to [facility] [place].   Please schedule and notify pt of appt:   Lab/MD in 1 year (cbc no diff)

## 2024-06-29 ENCOUNTER — Other Ambulatory Visit: Payer: Self-pay | Admitting: Family Medicine

## 2024-06-29 DIAGNOSIS — Z1231 Encounter for screening mammogram for malignant neoplasm of breast: Secondary | ICD-10-CM

## 2024-07-04 ENCOUNTER — Inpatient Hospital Stay: Admission: RE | Admit: 2024-07-04 | Discharge: 2024-07-04 | Attending: Family Medicine | Admitting: Family Medicine

## 2024-07-04 ENCOUNTER — Ambulatory Visit: Admission: RE | Admit: 2024-07-04 | Discharge: 2024-07-04 | Attending: Acute Care | Admitting: Acute Care

## 2024-07-04 DIAGNOSIS — F17210 Nicotine dependence, cigarettes, uncomplicated: Secondary | ICD-10-CM

## 2024-07-04 DIAGNOSIS — Z1231 Encounter for screening mammogram for malignant neoplasm of breast: Secondary | ICD-10-CM

## 2024-07-04 DIAGNOSIS — Z87891 Personal history of nicotine dependence: Secondary | ICD-10-CM

## 2024-07-04 DIAGNOSIS — Z122 Encounter for screening for malignant neoplasm of respiratory organs: Secondary | ICD-10-CM

## 2024-07-07 ENCOUNTER — Telehealth: Payer: Self-pay | Admitting: *Deleted

## 2024-07-07 NOTE — Telephone Encounter (Signed)
 Call report from [facility] Radiology:  IMPRESSION: Lung-RADS 4A, suspicious. Follow up low-dose chest CT without contrast in 3 months (please use the following order, CT CHEST LCS NODULE FOLLOW-UP W/O CM) is recommended. Alternatively, PET may be considered when there is a solid component 8mm or larger. Enlarging right upper lobe pulmonary nodule of 6.7 mm.   Aortic atherosclerosis (ICD10-I70.0), coronary artery atherosclerosis and emphysema (ICD10-J43.9).

## 2024-07-12 ENCOUNTER — Other Ambulatory Visit: Payer: Self-pay

## 2024-07-12 DIAGNOSIS — R911 Solitary pulmonary nodule: Secondary | ICD-10-CM

## 2024-07-12 DIAGNOSIS — Z87891 Personal history of nicotine dependence: Secondary | ICD-10-CM

## 2024-07-12 DIAGNOSIS — Z122 Encounter for screening for malignant neoplasm of respiratory organs: Secondary | ICD-10-CM

## 2024-07-12 NOTE — Telephone Encounter (Signed)
 Results have been reviewed by Ruthell, NP. Pt will need a 3 month follow up scan as the nodule is too small to PET. Please ask if she has had any weight loss or hemoptysis. Place order, send results and plan to PCP.

## 2024-07-12 NOTE — Telephone Encounter (Signed)
 Spoke with patient and reviewed recent Lung CT results. She will complete a 3 month follow up scan 10/04/2024 at [facility]. Order placed. She denies recent sickness, no weight loss or hemoptysis. Pt will call the office if any symptoms arise. Results and plan to PCP.

## 2024-07-14 ENCOUNTER — Telehealth: Payer: Self-pay | Admitting: *Deleted

## 2024-07-14 NOTE — Telephone Encounter (Signed)
 Returned call to patient. Discussed lung screening CT results in detail. Pt has an appt to see Dr Isadora on 08/03/2024. Patient expressed concern about having a follow up Chest CT sooner than 3 months. I advised pt that Dr Isadora will review her scans at that visit and can decide if sooner CT scan is needed. PT verbalized understanding and had no further questions.

## 2024-08-03 ENCOUNTER — Encounter: Payer: Self-pay | Admitting: Student in an Organized Health Care Education/Training Program

## 2024-08-03 ENCOUNTER — Ambulatory Visit: Admitting: Student in an Organized Health Care Education/Training Program

## 2024-08-03 VITALS — BP 120/68 | HR 69 | Temp 97.9°F | Ht 65.5 in | Wt 190.6 lb

## 2024-08-03 DIAGNOSIS — R0602 Shortness of breath: Secondary | ICD-10-CM | POA: Diagnosis not present

## 2024-08-03 DIAGNOSIS — J449 Chronic obstructive pulmonary disease, unspecified: Secondary | ICD-10-CM

## 2024-08-03 DIAGNOSIS — R911 Solitary pulmonary nodule: Secondary | ICD-10-CM

## 2024-08-03 DIAGNOSIS — F17210 Nicotine dependence, cigarettes, uncomplicated: Secondary | ICD-10-CM | POA: Diagnosis not present

## 2024-08-03 DIAGNOSIS — F17200 Nicotine dependence, unspecified, uncomplicated: Secondary | ICD-10-CM

## 2024-08-03 MED ORDER — SPIRIVA RESPIMAT 2.5 MCG/ACT IN AERS: 2.0000 | INHALATION_SPRAY | Freq: Every day | RESPIRATORY_TRACT | 12 refills | Status: AC

## 2024-08-03 MED ORDER — NICOTINE POLACRILEX 2 MG MT LOZG: 2.0000 mg | LOZENGE | OROMUCOSAL | 3 refills | Status: AC | PRN

## 2024-08-03 MED ORDER — NICOTINE 14 MG/24HR TD PT24: 14.0000 mg | MEDICATED_PATCH | TRANSDERMAL | 0 refills | Status: AC

## 2024-08-03 MED ORDER — NICOTINE 7 MG/24HR TD PT24: 7.0000 mg | MEDICATED_PATCH | TRANSDERMAL | 0 refills | Status: AC

## 2024-08-03 MED ORDER — NICOTINE 21 MG/24HR TD PT24: 21.0000 mg | MEDICATED_PATCH | TRANSDERMAL | 0 refills | Status: AC

## 2024-08-03 NOTE — Progress Notes (Signed)
 "  Assessment & Plan  #Right upper lobe pulmonary nodule  Presents for the evaluation of a pulmonary nodule noted on LDCT for lung cancer screening. Nodule increased from 4.2 mm (2024) to 6.7 mm (2025), with surrounding ground glass. The CT scan is notable for mosaic attenuation and ground glass throughout the lung, and I suspect the ground glass around the nodule is related to said inflammation. I suspect an inflammatory response secondary to smoking, such as respiratory bronchiolitis, especially that these findings were present on prior CT. Nodule is currently too small to PET or safely biopsy. Will repeat a scan at the 3 month mark for surveillance, and consider PET or biopsy if shows further growth.  - Ordered repeat CT scan in early March to monitor nodule size and inflammation. - Will review CT scan results in March to determine if further intervention is necessary.  #Chronic obstructive pulmonary disease  Signs of air trapping and obstructive lung disease consistent with COPD. Smoking and cotton dust exposure likely contributors. She has signs of respiratory bronchiolitis on her CT. She is currently on ICS/LABA in addition to albuterol. Will start LAMA and have prescribed Spiriva  Respimat, two puffs once daily in addition to current Symbicort regimen. Will obtain PFT's to confirm COPD and establish a baseline.  - Ordered pulmonary function test to assess extent of COPD. - Tiotropium Bromide (SPIRIVA  RESPIMAT) 2.5 MCG/ACT AERS; Inhale 2 puffs into the lungs daily.  Dispense: 4 g; Refill: 12  #Tobacco Use Disorder  Smokes up to a pack a day. Cessation crucial for lung health and reducing nodule growth risk. Prescribed nicotine  patch, starting with 21 mg for six weeks, then tapering to lower doses for two weeks each. Also prescribed nicotine  lozenges to manage cravings.  - nicotine  (NICODERM CQ  - DOSED IN MG/24 HOURS) 21 mg/24hr patch; Place 1 patch (21 mg total) onto the skin daily.   Dispense: 42 patch; Refill: 0 - nicotine  (NICODERM CQ  - DOSED IN MG/24 HOURS) 14 mg/24hr patch; Place 1 patch (14 mg total) onto the skin daily for 14 days.  Dispense: 14 patch; Refill: 0 - nicotine  (NICODERM CQ  - DOSED IN MG/24 HR) 7 mg/24hr patch; Place 1 patch (7 mg total) onto the skin daily for 14 days.  Dispense: 14 patch; Refill: 0 - nicotine  polacrilex (NICOTINE  MINI) 2 MG lozenge; Take 1 lozenge (2 mg total) by mouth every 2 (two) hours as needed for smoking cessation.  Dispense: 72 lozenge; Refill: 3 - Encouraged reduction in smoking with the goal of cessation.   Return in about 9 weeks (around 10/05/2024).  Belva November, MD [facility]  I spent 60 minutes caring for this patient today, including preparing to see the patient, obtaining a medical history , reviewing a separately obtained history, performing a medically appropriate examination and/or evaluation, counseling and educating the patient/family/caregiver, ordering medications, tests, or procedures, documenting clinical information in the electronic health record, and independently interpreting results (not separately reported/billed) and communicating results to the patient/family/caregiver  End of visit medications:  Meds ordered this encounter  Medications   nicotine  (NICODERM CQ  - DOSED IN MG/24 HOURS) 21 mg/24hr patch    Sig: Place 1 patch (21 mg total) onto the skin daily.    Dispense:  42 patch    Refill:  0   nicotine  (NICODERM CQ  - DOSED IN MG/24 HOURS) 14 mg/24hr patch    Sig: Place 1 patch (14 mg total) onto the skin daily for 14 days.    Dispense:  14 patch    Refill:  0   nicotine  (NICODERM CQ  - DOSED IN MG/24 HR) 7 mg/24hr patch    Sig: Place 1 patch (7 mg total) onto the skin daily for 14 days.    Dispense:  14 patch    Refill:  0   nicotine  polacrilex (NICOTINE  MINI) 2 MG lozenge    Sig: Take 1 lozenge (2 mg total) by mouth every 2 (two) hours as needed for smoking cessation.     Dispense:  72 lozenge    Refill:  3   Tiotropium Bromide (SPIRIVA  RESPIMAT) 2.5 MCG/ACT AERS    Sig: Inhale 2 puffs into the lungs daily.    Dispense:  4 g    Refill:  12    Current Medications[1]   Subjective:   PATIENT ID: Margaret Delgado GENDER: female DOB: 1961-04-02, MRN: [id number]  Chief Complaint  Patient presents with   Consult    HPI  Discussed the use of AI scribe software for clinical note transcription with the patient, who gave verbal consent to proceed.  History of Present Illness  Margaret Delgado is a 64 year old female who presents with a right upper lobe pulmonary nodule.  A right upper lobe pulmonary nodule was initially identified on a low-dose CT scan last year, measuring four millimeters. A repeat scan this year showed an increase in size to six millimeters.  She experiences fatigue for the past three to four months, feeling tired without any specific precipitating events. She also experiences shortness of breath after climbing a flight of 17 steps to her apartment, but does not have a persistent cough. She occasionally brings up phlegm, described as 'dingy looking' or grayish, and attributes this to smoking. No chest tightness, but occasional wheezing, particularly after exertion.  She has a significant smoking history, currently smoking up to a pack a day, having started at age 58 or 50. She previously smoked up to two packs a day when she was drinking alcohol, which she quit six months ago. She has a history of heavy alcohol use, drinking beer throughout the day, but stopped after abnormal liver function tests were noted.  Her occupational history includes working in [employer] from age 28 to her forties, exposing her to cotton dust. She also worked as a engineer, structural for elderly individuals and was involved in scientist, forensic until a few months ago.  She has a history of a severe lung infection approximately 15 years ago, which required hospitalization for 11 days  due to low oxygen levels.   Ancillary information including prior medications, full medical/surgical/family/social histories, and PFTs (when available) are listed below and have been reviewed.   Review of Systems  Constitutional:  Negative for chills, fever and weight loss.  Respiratory:  Positive for sputum production, shortness of breath and wheezing. Negative for cough and hemoptysis.   Cardiovascular:  Negative for chest pain.     Objective:   Vitals:   08/03/24 0813  BP: 120/68  Pulse: 69  Temp: 97.9 F (36.6 C)  TempSrc: Temporal  SpO2: 97%  Weight: 190 lb 9.6 oz (86.5 kg)  Height: 5' 5.5 (1.664 m)   97% on RA  BMI Readings from Last 3 Encounters:  08/03/24 31.24 kg/m  04/04/24 30.42 kg/m  04/22/22 32.92 kg/m   Wt Readings from Last 3 Encounters:  08/03/24 190 lb 9.6 oz (86.5 kg)  04/04/24 185 lb 9.6 oz (84.2 kg)  04/22/22 200 lb 14.4 oz (91.1 kg)  Physical Exam Constitutional:      Appearance: Normal appearance. She is obese.  Cardiovascular:     Rate and Rhythm: Normal rate and regular rhythm.     Pulses: Normal pulses.     Heart sounds: Normal heart sounds.  Pulmonary:     Effort: Pulmonary effort is normal.     Breath sounds: Normal breath sounds. No wheezing or rales.  Neurological:     General: No focal deficit present.     Mental Status: She is alert and oriented to person, place, and time. Mental status is at baseline.       Ancillary Information    Past Medical History:  Diagnosis Date   COPD (chronic obstructive pulmonary disease) (HCC)    Hypercholesterolemia    Major depressive disorder, recurrent episode, moderate (HCC)    Panic disorder without agoraphobia      Family History  Problem Relation Age of Onset   Parkinson's disease Mother    Depression Mother    Anxiety disorder Mother    Cancer Father    Lung cancer Father    Brain cancer Father    Stroke Sister    Anxiety disorder Sister    Depression Sister     Breast cancer Paternal Grandmother 44     Past Surgical History:  Procedure Laterality Date   CESAREAN SECTION     COLONOSCOPY WITH PROPOFOL  N/A 08/28/2020   Procedure: COLONOSCOPY WITH PROPOFOL ;  Surgeon: Therisa Bi, MD;  Location[facility] ENDOSCOPY;  Service: Gastroenterology;  Laterality: N/A;    Social History   Socioeconomic History   Marital status: Single    Spouse name: Not on file   Number of children: Not on file   Years of education: Not on file   Highest education level: Not on file  Occupational History   Not on file  Tobacco Use   Smoking status: Every Day    Current packs/day: 1.00    Average packs/day: 1 pack/day for 47.0 years (47.0 ttl pk-yrs)    Types: Cigarettes    Start date: 1979   Smokeless tobacco: Never  Vaping Use   Vaping status: Never Used  Substance and Sexual Activity   Alcohol use: Not Currently    Comment: occasionl   Drug use: No   Sexual activity: Not Currently  Other Topics Concern   Not on file  Social History Narrative   Not on file   Social Drivers of Health   Tobacco Use: High Risk (08/03/2024)   Patient History    Smoking Tobacco Use: Every Day    Smokeless Tobacco Use: Never    Passive Exposure: Not on file  Financial Resource Strain: Not on file  Food Insecurity: Not on file  Transportation Needs: Not on file  Physical Activity: Not on file  Stress: Not on file  Social Connections: Not on file  Intimate Partner Violence: Not on file  Depression (PHQ2-9): Low Risk (04/04/2024)   Depression (PHQ2-9)    PHQ-2 Score: 0  Alcohol Screen: Not on file  Housing: Unknown (03/15/2024)   Received from [facility]   Epic    Unable to Pay for Housing in the Last Year: Not on file    Number of Times Moved in the Last Year: Not on file    At any time in the past 12 months, were you homeless or living in a shelter (including now)?: No  Utilities: Not on file  Health Literacy: Not on file  Allergies[2]   CBC     Component Value Date/Time   WBC 11.2 (H) 04/04/2024 1509   RBC 5.05 04/04/2024 1509   HGB 15.9 (H) 04/04/2024 1509   HGB 13.4 07/20/2014 0512   HCT 47.5 (H) 04/04/2024 1509   HCT 40.7 07/20/2014 0512   PLT 349 04/04/2024 1509   PLT 357 07/20/2014 0512   MCV 94.1 04/04/2024 1509   MCV 93 07/20/2014 0512   MCH 31.5 04/04/2024 1509   MCHC 33.5 04/04/2024 1509   RDW 11.8 04/04/2024 1509   RDW 12.8 07/20/2014 0512   LYMPHSABS 3.9 04/04/2024 1509   LYMPHSABS 2.4 07/20/2014 0512   MONOABS 0.7 04/04/2024 1509   MONOABS 1.1 (H) 07/20/2014 0512   EOSABS 0.2 04/04/2024 1509   EOSABS 0.0 07/20/2014 0512   BASOSABS 0.1 04/04/2024 1509   BASOSABS 0.0 07/20/2014 9487    Pulmonary Functions Testing Results:     No data to display          Outpatient Medications Prior to Visit  Medication Sig Dispense Refill   albuterol (VENTOLIN HFA) 108 (90 Base) MCG/ACT inhaler Inhale into the lungs.     aspirin EC 81 MG tablet Take 81 mg by mouth daily.     atorvastatin (LIPITOR) 40 MG tablet Take 1 tablet by mouth daily.     budesonide-formoterol (SYMBICORT) 160-4.5 MCG/ACT inhaler Inhale 2 puffs into the lungs 2 (two) times daily.     isosorbide mononitrate (IMDUR) 30 MG 24 hr tablet Take 30 mg by mouth.     LORazepam (ATIVAN) 1 MG tablet Take 1 mg by mouth 3 (three) times daily.     Multiple Vitamin tablet Take 1 tablet by mouth daily.     sertraline  (ZOLOFT ) 100 MG tablet Take 1 tablet (100 mg total) by mouth daily after breakfast. 30 tablet 1   solifenacin (VESICARE) 5 MG tablet Take 5 mg by mouth daily.     No facility-administered medications prior to visit.      [1]  Current Outpatient Medications:    albuterol (VENTOLIN HFA) 108 (90 Base) MCG/ACT inhaler, Inhale into the lungs., Disp: , Rfl:    aspirin EC 81 MG tablet, Take 81 mg by mouth daily., Disp: , Rfl:    atorvastatin (LIPITOR) 40 MG tablet, Take 1 tablet by mouth daily., Disp: , Rfl:    budesonide-formoterol (SYMBICORT)  160-4.5 MCG/ACT inhaler, Inhale 2 puffs into the lungs 2 (two) times daily., Disp: , Rfl:    isosorbide mononitrate (IMDUR) 30 MG 24 hr tablet, Take 30 mg by mouth., Disp: , Rfl:    LORazepam (ATIVAN) 1 MG tablet, Take 1 mg by mouth 3 (three) times daily., Disp: , Rfl:    Multiple Vitamin tablet, Take 1 tablet by mouth daily., Disp: , Rfl:    nicotine  (NICODERM CQ  - DOSED IN MG/24 HOURS) 14 mg/24hr patch, Place 1 patch (14 mg total) onto the skin daily for 14 days., Disp: 14 patch, Rfl: 0   nicotine  (NICODERM CQ  - DOSED IN MG/24 HOURS) 21 mg/24hr patch, Place 1 patch (21 mg total) onto the skin daily., Disp: 42 patch, Rfl: 0   nicotine  (NICODERM CQ  - DOSED IN MG/24 HR) 7 mg/24hr patch, Place 1 patch (7 mg total) onto the skin daily for 14 days., Disp: 14 patch, Rfl: 0   nicotine  polacrilex (NICOTINE  MINI) 2 MG lozenge, Take 1 lozenge (2 mg total) by mouth every 2 (two) hours as needed for smoking cessation., Disp: 72 lozenge, Rfl: 3  sertraline  (ZOLOFT ) 100 MG tablet, Take 1 tablet (100 mg total) by mouth daily after breakfast., Disp: 30 tablet, Rfl: 1   solifenacin (VESICARE) 5 MG tablet, Take 5 mg by mouth daily., Disp: , Rfl:    Tiotropium Bromide (SPIRIVA  RESPIMAT) 2.5 MCG/ACT AERS, Inhale 2 puffs into the lungs daily., Disp: 4 g, Rfl: 12 [2]  Allergies Allergen Reactions   Aripiprazole Nausea And Vomiting and Nausea Only   "

## 2024-08-03 NOTE — Patient Instructions (Signed)
 " VISIT SUMMARY: Today, we discussed the increase in size of the nodule in your right upper lung, your chronic obstructive pulmonary disease (COPD), and your smoking habits. We reviewed your symptoms, including fatigue and shortness of breath, and discussed your smoking history and occupational exposures.  YOUR PLAN: -RIGHT UPPER LOBE PULMONARY NODULE: A nodule in your right upper lung has grown from 4.2 mm to 6.7 mm, likely due to inflammation from smoking rather than cancer. We will monitor it with another CT scan in early March to see if it changes in size or appearance.  -CHRONIC OBSTRUCTIVE PULMONARY DISEASE (COPD): COPD is a lung condition that makes it hard to breathe due to long-term damage, often from smoking or exposure to irritants like cotton dust. We will perform a pulmonary function test to see how severe your COPD is. You should start using Spiriva  Respimat, two puffs once daily, in addition to your current Symbicort inhaler.  -NICOTINE  DEPENDENCE: Nicotine  dependence means you are addicted to smoking. Quitting smoking is crucial for your lung health and to reduce the risk of your lung nodule growing. We have prescribed a nicotine  patch, starting with 21 mg for six weeks and then tapering down, along with nicotine  lozenges to help manage cravings. Reducing and eventually quitting smoking is strongly encouraged.  INSTRUCTIONS: Please schedule a follow-up appointment in early March for a repeat CT scan to monitor the lung nodule. Additionally, schedule a pulmonary function test to assess the extent of your COPD. Continue using your prescribed medications and follow the nicotine  cessation plan. If you have any questions or concerns, please contact our office.   The [place]  Quitline: Call 1-800-QUIT-NOW (430-844-3176). The [place] Quitline is a free service for [place]  residents. Trained counselors are available from 8 am until 3 am, 365 days per year. Services are available in  both English and Spanish.   Web Resources Free online support programs can help you track your progress and share experiences with others who are quitting. These are examples: www.becomeanex.org www.trytostop.org  www.smokefree.gov  www.https://www.vargas.com/.aspx  [facility]: offers comprehensive in-person tobacco treatment counseling at [facility] building ([address], [place] [address]).  Open to everyone. Virtual appointments available. Free parking. Call [phone] to schedule an appointment or [phone] for general information.    Tobacco Cessation Medications  Nicotine  Replacement Therapy (NRT)  Nicotine  is the addictive part of tobacco smoke, but not the most dangerous part. There are 7000 other toxins in cigarettes, including carbon monoxide, that cause disease. People do not generally become addicted to medication. Common problems: People don't use enough medication or stop too early. Medications are safe and effective. Overdose is very uncommon. Use medications as long as needed (3 months minimum). Some combinations work better than single medications. Long acting medications like the NRT patch and bupropion  provide continuous treatment for withdrawal symptoms.  PLUS  Short acting medications like the NRT gum, lozenge, inhaler, and nasal spray help people to cope with breakthrough cravings.  ? Nicotine  Patch  Place patch on hairless skin on upper body, including arms and back. Each day: discard old patch, shower, apply new patch to a different site. Apply hydrocortisone cream to mildly red/irritated areas. Call provider if rash develops. If patch causes sleep disturbance, remove patch at bedtime and replace each morning after shower. Side effects may include: skin irritation, headache, insomnia, abnormal/vivid dreams.  ? Nicotine  Gum  Chew gum slowly, park in cheek when peppery taste or tingling sensation  begins (  about 15-30 chews). When taste or tingling goes away, begin chewing again. Use until nicotine  is gone (taste or tingle does not return, usually 30 minutes). Park in different areas of mouth. Nicotine  is absorbed through the lining of the mouth. Use enough to control cravings, up to 24 pieces per day (if used alone). Avoid eating or drinking for 15 minutes before using and during use. Side effects may include: mouth/jaw soreness, hiccups, indigestion, hypersalivation.  If gum is not chewed correctly, additional side effects may include lightheadedness, nausea/vomiting, throat and mouth irritation.  ? Nicotine  Lozenge  Allow to dissolve slowly in mouth (20-30 minutes). Do not chew or swallow. Nicotine  release may cause a warm tingling sensation. Occasionally rotate to different areas of the mouth. Use enough to control cravings, up to 20 lozenges per day (if used alone). Avoid eating or drinking for 15 minutes before using and during use. Side effects may include: nausea, hiccups, cough, heartburn, headache, gas, insomnia.  ? Nicotine  Nasal Spray Use 1 spray in each nostril (1 dose) and tilt head back for 1 minute. Do not sniff, swallow, or inhale through nose.  Use at least 8 doses (1 spray in each nostril) , up to 40 doses per day (if used alone). To reduce nasal irritation, spray on cotton swab and insert into nose. Side effects may include: nasal and/or throat irritation (hot, peppery, or burning sensation), nasal irritation, tearing, sneezing, cough, headache.  ? Nicotine  Oral Inhaler (puffer) Inhale into the back of the throat or puff in short breaths. Do not inhale into the lungs.  Puff continuously for 20 minutes (about 80 puffs) until cartridge is empty. Change cartridge when it loses the burning in throat sensation (feels like air only). Open cartridges can be saved and used again within 24 hours. Use at least 6 and up to 16 cartridges per day (if used alone).  Avoid  eating or drinking for 15 minutes before using and during use. Side effects may include: mouth and/or throat irritation, unpleasant taste, cough, nasal irritation, indigestion, hiccups, headache.  ? Chantix (varenicline) Days 1-3: Take one 0.5 mg white pill each morning for 3 days, one week before quit date. Days 4-7: Increase to one 0.5 mg white pill twice a day in morning and evening for 4 days.  On Day 8 (target quit date), increase to one 1 mg blue pill twice a day. Maintain this dose for a minimum of 3 months. Take with food and a full glass of water to reduce nausea. Be sure that the two doses are at least 8 hours apart, but try to take second dose early in the evening (i.e. 6 pm) to avoid sleep problems. Common side effects include: nausea, insomnia, headache, abnormal/vivid dreams. Tell your doctor if you have any history of psychiatric illness prior to starting Chantix.  STOP taking CHANTIX and contact a healthcare provider immediately if you experience agitation, hostility, depressed mood, changes in thoughts or behavior that are not typical for you, thinking about or attempting suicide, allergic or skin reactions including swelling, rash, redness, or peeling of the skin.  For patients who have heart disease: Smoking is a major risk factor for cardiovascular disease, and Chantix can help you quit smoking. Chantix may be associated with a small, increased risk of certain heart events in patients who have heart disease. If you have any new or worsening symptoms of heart disease while taking Chantix, such as shortness of breath or trouble breathing, new or worsening chest pain, or new  or worsening pain in your legs when walking, call your doctor or get emergency medical help immediately.  ? Wellbutrin  / Zyban  (bupropion ) Take one 150 mg pill each morning for 3 days, one week before target quit date. On Day 4, increase to one 150 mg pill twice a day, morning and evening.  Maintain this dose  for a minimum of 3 months. Be sure that the two doses are at least 8 hours apart, but try to take second dose early in the evening (i.e. 6 pm) to avoid sleep problems. Avoid or minimize use of alcohol when taking this medication. Common side effects include: dry mouth, headache, insomnia, nausea, weight loss.  Risk of seizure is 07/998. STOP taking BUPROPION  and contact a healthcare provider immediately if you experience agitation, hostility, depressed mood, changes in thoughts or behavior that are not typical for you, thinking about or attempting suicide, allergic or skin reactions including swelling, rash, redness, or peeling of the skin.   Contains text generated by [employer].   "

## 2024-10-04 ENCOUNTER — Ambulatory Visit

## 2024-11-23 ENCOUNTER — Encounter

## 2024-11-23 ENCOUNTER — Ambulatory Visit: Admitting: Student in an Organized Health Care Education/Training Program

## 2025-04-10 ENCOUNTER — Other Ambulatory Visit

## 2025-04-10 ENCOUNTER — Ambulatory Visit: Admitting: Oncology
